# Patient Record
Sex: Female | Born: 1985 | Hispanic: Yes | Marital: Married | Smoking: Never smoker
Health system: Southern US, Community
[De-identification: ages and names within clinical notes are randomized; demographics above are authoritative.]

## PROBLEM LIST (undated history)

## (undated) HISTORY — PX: NO PAST SURGERIES: SHX2092

---

## 2020-04-15 DIAGNOSIS — Z8619 Personal history of other infectious and parasitic diseases: Secondary | ICD-10-CM

## 2020-04-15 HISTORY — DX: Personal history of other infectious and parasitic diseases: Z86.19

## 2020-05-12 ENCOUNTER — Other Ambulatory Visit: Payer: Self-pay

## 2020-05-12 ENCOUNTER — Ambulatory Visit (HOSPITAL_COMMUNITY)
Admission: EM | Admit: 2020-05-12 | Discharge: 2020-05-12 | Disposition: A | Discharge status: Home / Self Care | Payer: Self-pay | Attending: Internal Medicine | Admitting: Internal Medicine

## 2020-05-12 DIAGNOSIS — Z3201 Encounter for pregnancy test, result positive: Secondary | ICD-10-CM

## 2020-05-12 DIAGNOSIS — R109 Unspecified abdominal pain: Secondary | ICD-10-CM

## 2020-05-12 LAB — POCT URINALYSIS DIPSTICK, ED / UC
Bilirubin Urine: NEGATIVE
Glucose, UA: NEGATIVE mg/dL
Ketones, ur: NEGATIVE mg/dL
Nitrite: NEGATIVE
Protein, ur: NEGATIVE mg/dL
Specific Gravity, Urine: 1.01 (ref 1.005–1.030)
Urobilinogen, UA: 0.2 mg/dL (ref 0.0–1.0)
pH: 7 (ref 5.0–8.0)

## 2020-05-12 LAB — POC URINE PREG, ED: Preg Test, Ur: POSITIVE — AB

## 2020-05-12 NOTE — ED Notes (Signed)
Patient is being discharged from the Urgent Care and sent to the [facility] via private vehicle . Per Wendee Beavers, NP, patient is in need of higher level of care due to positive pregnancy test and abdominal pain. Patient is aware and verbalizes understanding of plan of care. There were no vitals filed for this visit.

## 2020-05-12 NOTE — ED Notes (Signed)
Lower abdominal pain, nausea, dizziness and a positive pregnancy test.  Utilized interpreter services.   Spoke to [employer], np.  [facility].  Patient agreeable, family member returning to pick up patient

## 2020-05-14 ENCOUNTER — Encounter (HOSPITAL_COMMUNITY): Payer: Self-pay | Admitting: Obstetrics and Gynecology

## 2020-05-14 ENCOUNTER — Inpatient Hospital Stay (HOSPITAL_COMMUNITY)
Admission: AD | Admit: 2020-05-14 | Discharge: 2020-05-14 | Disposition: A | Discharge status: Home / Self Care | Payer: Self-pay | Attending: Obstetrics and Gynecology | Admitting: Obstetrics and Gynecology

## 2020-05-14 ENCOUNTER — Other Ambulatory Visit: Payer: Self-pay

## 2020-05-14 ENCOUNTER — Inpatient Hospital Stay (HOSPITAL_COMMUNITY): Payer: Self-pay

## 2020-05-14 DIAGNOSIS — O26891 Other specified pregnancy related conditions, first trimester: Secondary | ICD-10-CM | POA: Insufficient documentation

## 2020-05-14 DIAGNOSIS — Z3A01 Less than 8 weeks gestation of pregnancy: Secondary | ICD-10-CM | POA: Insufficient documentation

## 2020-05-14 DIAGNOSIS — O210 Mild hyperemesis gravidarum: Secondary | ICD-10-CM | POA: Insufficient documentation

## 2020-05-14 DIAGNOSIS — R109 Unspecified abdominal pain: Secondary | ICD-10-CM | POA: Insufficient documentation

## 2020-05-14 DIAGNOSIS — Z3490 Encounter for supervision of normal pregnancy, unspecified, unspecified trimester: Secondary | ICD-10-CM

## 2020-05-14 DIAGNOSIS — O99891 Other specified diseases and conditions complicating pregnancy: Secondary | ICD-10-CM | POA: Insufficient documentation

## 2020-05-14 DIAGNOSIS — R300 Dysuria: Secondary | ICD-10-CM | POA: Insufficient documentation

## 2020-05-14 DIAGNOSIS — O2341 Unspecified infection of urinary tract in pregnancy, first trimester: Secondary | ICD-10-CM

## 2020-05-14 LAB — WET PREP, GENITAL
Clue Cells Wet Prep HPF POC: NONE SEEN
Sperm: NONE SEEN
Trich, Wet Prep: NONE SEEN
Yeast Wet Prep HPF POC: NONE SEEN

## 2020-05-14 LAB — COMPREHENSIVE METABOLIC PANEL
ALT: 19 U/L (ref 0–44)
AST: 16 U/L (ref 15–41)
Albumin: 3.6 g/dL (ref 3.5–5.0)
Alkaline Phosphatase: 49 U/L (ref 38–126)
Anion gap: 10 (ref 5–15)
BUN: 6 mg/dL (ref 6–20)
CO2: 22 mmol/L (ref 22–32)
Calcium: 8.8 mg/dL — ABNORMAL LOW (ref 8.9–10.3)
Chloride: 102 mmol/L (ref 98–111)
Creatinine, Ser: 0.49 mg/dL (ref 0.44–1.00)
GFR calc Af Amer: >60 mL/min (ref >60)
GFR calc non Af Amer: >60 mL/min (ref >60)
Glucose, Bld: 95 mg/dL (ref 70–99)
Potassium: 3.3 mmol/L — ABNORMAL LOW (ref 3.5–5.1)
Sodium: 134 mmol/L — ABNORMAL LOW (ref 135–145)
Total Bilirubin: 0.5 mg/dL (ref 0.3–1.2)
Total Protein: 7 g/dL (ref 6.5–8.1)

## 2020-05-14 LAB — URINALYSIS, ROUTINE W REFLEX MICROSCOPIC
Bilirubin Urine: NEGATIVE
Glucose, UA: NEGATIVE mg/dL
Hgb urine dipstick: NEGATIVE
Ketones, ur: 5 mg/dL — AB
Nitrite: NEGATIVE
Protein, ur: NEGATIVE mg/dL
Specific Gravity, Urine: 1.012 (ref 1.005–1.030)
pH: 6 (ref 5.0–8.0)

## 2020-05-14 LAB — CBC
HCT: 38.2 % (ref 36.0–46.0)
Hemoglobin: 12.5 g/dL (ref 12.0–15.0)
MCH: 27.2 pg (ref 26.0–34.0)
MCHC: 32.7 g/dL (ref 30.0–36.0)
MCV: 83 fL (ref 80.0–100.0)
Platelets: 252 10*3/uL (ref 150–400)
RBC: 4.6 MIL/uL (ref 3.87–5.11)
RDW: 13.9 % (ref 11.5–15.5)
WBC: 8.5 10*3/uL (ref 4.0–10.5)
nRBC: 0 % (ref 0.0–0.2)

## 2020-05-14 LAB — HCG, QUANTITATIVE, PREGNANCY: hCG, Beta Chain, Quant, S: 187362 m[IU]/mL — ABNORMAL HIGH (ref <5)

## 2020-05-14 LAB — ABO/RH: ABO/RH(D): O POS

## 2020-05-14 MED ORDER — CEPHALEXIN 500 MG PO CAPS: 500.0000 mg | ORAL_CAPSULE | Freq: Four times a day (QID) | ORAL | 2 refills | Status: DC

## 2020-05-14 NOTE — MAU Note (Signed)
.   Katherine Robles is a 34 y.o. at [redacted]w[redacted]d here in MAU reporting: nausea and pain with urination.  LMP: 03/22/20 Onset of complaint: a week Pain score: 0 Vitals:   05/14/20 1652 05/14/20 1653  BP: 105/61   Pulse: 72   Resp: 16   Temp: 98.5 F (36.9 C)   SpO2:  100%     FHT: Lab orders placed from triage: UA

## 2020-05-14 NOTE — Discharge Instructions (Signed)
Embarazo e infeccin urinaria Pregnancy and Urinary Tract Infection  Una infeccin urinaria (IU) puede ocurrir en Clinical cytogeneticist de las vas [place]. Estas incluyen los riones, los tubos que [employer] riones con la vejiga (urteres), la vejiga y el tubo por el que se elimina la orina del cuerpo (uretra). Estos rganos fabrican, Buyer, retail y eliminan la orina del organismo. El mdico puede usar otras palabras para describir la infeccin. La IU alta afecta los urteres y a los riones (pielonefritis). La IU baja afecta la vejiga (cistitis) y Geologist, engineering (uretritis). La mayora de las infecciones de las vas urinarias es causada por la presencia de bacterias en la zona genital, alrededor de la entrada de las vas urinarias (uretra). Estas bacterias proliferan y causan irritacin e inflamacin de las vas [place]. Usted tiene ms probabilidades de tener una IU durante el embarazo porque los cambios fsicos y hormonales por los que atraviesa el cuerpo pueden hacer que sea ms fcil que las bacterias ingresen en las vas urinarias. El beb en gestacin tambin hace presin sobre la vejiga y puede afectar el flujo de [place]. Es Glass blower/designer y tratar las IU [employer] embarazo debido al riesgo de complicaciones graves tanto para usted como para el beb. Lowry Ram modo me afecta? Entre los sntomas de una IU, se incluyen los siguientes:  Necesidad inmediata (urgente) de Garment/textile technologist.  Miccin frecuente o eliminacin de pequeas cantidades de orina con frecuencia.  Ardor o dolor al [employer].  Presencia de [employer].  Orina con mal olor u [employer] atpico.  Dificultad para Garment/textile technologist.  Bennie Hind turbia.  Dolor en el abdomen o en la parte inferior de la espalda.  Secrecin vaginal. Tambin puede presentar:  Vmitos o disminucin del apetito.  Confusin.  Irritabilidad o cansancio.  Cristy Hilts.  Diarrea. Cmo afecta esto al beb? Una IU no tratada durante el embarazo podra ocasionar una  infeccin renal o una infeccin generalizada, lo que puede causar problemas de salud que afecten al beb. Algunas de las complicaciones posibles de una IU no tratada son las siguientes:  Dar a luz al beb antes de las 37semanas de Media planner (prematuro).  Tener un beb con bajo peso al nacer.  Presentar presin arterial alta durante el embarazo (preeclampsia).  Tener un nivel bajo de hemoglobina (anemia). Qu puedo hacer para disminuir el riesgo? Para prevenir la IU, haga lo siguiente:  Vaya al bao en cuanto sienta la necesidad de [place]. No contenga la orina durante largos perodos de [place].  Lmpiese siempre desde adelante hacia atrs, en especial despus de defecar. Use cada trozo de papel higinico solo una vez cuando se limpie.  Vace la vejiga despus de Clinical biochemist.  Mantenga la zona genital seca.  Bonnita Nasuti entre 6 y 10vasos de agua por Training and development officer.  No se haga duchas vaginales ni use desodorantes en aerosol. Cmo se trata? El tratamiento de esta afeccin puede incluir lo siguiente:  Antibiticos cuyo uso es seguro durante el Media planner.  Otros medicamentos para tratar causas menos frecuentes de IU. Siga estas instrucciones en su casa:  Si le recetaron un antibitico, tmelo como se lo haya indicado el mdico. No deje de usar el antibitico aunque comience a Sports administrator.  Concurra a todas las visitas de seguimiento como se lo haya indicado el mdico. Esto es importante. Comunquese con un mdico si:  Los sntomas no mejoran o empeoran.  Tiene una secrecin vaginal anormal. Solicite ayuda inmediatamente si:  Katherine Robles.  Tiene nuseas y vmitos.  Siente dolor  en la espalda o en el costado del cuerpo.  Siente contracciones en el tero.  Siente dolor en la parte inferior del abdomen.  Tiene una prdida de lquido abundante por la vagina.  Observa sangre en la orina. Resumen  Una infeccin urinaria (IU) es una infeccin en cualquier parte de las  vas urinarias, que incluyen los riones, los urteres, la vejiga y la uretra.  La mayora de las infecciones de las vas urinarias es causada por la presencia de bacterias en la zona genital, alrededor de la entrada de las vas urinarias (uretra).  Es ms probable presentar una IU durante el embarazo.  Si le recetaron un antibitico, tmelo como se lo haya indicado el mdico. No deje de usar el antibitico aunque comience a sentirse mejor. Esta informacin no tiene como fin reemplazar el consejo del mdico. Asegrese de hacerle al mdico cualquier pregunta que tenga. Document Revised: 08/20/2018 Document Reviewed: 08/20/2018 Elsevier Patient Education  2020 Elsevier Inc.  

## 2020-05-14 NOTE — MAU Provider Note (Addendum)
History     CSN: [id number]  Arrival date and time: 05/14/20 1549   First Provider Initiated Contact with Patient 05/14/20 1742      Chief Complaint  Patient presents with  . Dysuria  . Morning Sickness   HPI: Katherine Robles is a 34 y.o. [phone] female at 8177w4d by LMP here with complaints of abdominal pain, nausea and difficulty urinating. She went to the urgent care on Tuesday for abdominal pain and dysuria and received her positive pregnancy test. She was then told to come here. She describes her nausea and abdominal pain as "feeling like she is going to explode" after she eats a small amount of food. Also describes the feeling of needing to burp but being unable to. She did not have this problem in her prior pregnancies. Denies vomiting or GERD. Endorses some decrease in appetite due to her constant nausea. Her last bowel movement was yesterday but she feels as thought she is not able to pass gas like normal. Denies sick contacts.  The patient describes her urinary discomfort as burning when she urinates and "feeling like she is peeing through jelly." She states she has had problems fully emptying her bladder since this began. Her urine color is dark yellow but she denies hematuria. This pain has been gradually increasing over the past week. She has not taken any medication to relieve her pain. Denies alleviating or aggravating factors.   OB History    Gravida  4   Para  2   Term  2   Preterm      AB  1   Living  2     SAB  1   TAB      Ectopic      Multiple      Live Births  2           History reviewed. No pertinent past medical history.  History reviewed. No pertinent surgical history.  History reviewed. No pertinent family history.  Social History   Tobacco Use  . Smoking status: Never Smoker  . Smokeless tobacco: Never Used  Substance Use Topics  . Alcohol use: Never  . Drug use: Never    Allergies: No Known Allergies  No medications  prior to admission.    Review of Systems  Constitutional: Positive for appetite change. Negative for fever.  Respiratory: Negative for cough and shortness of breath.   Cardiovascular: Negative for chest pain.  Gastrointestinal: Positive for abdominal pain and nausea. Negative for constipation, diarrhea and vomiting.  Genitourinary: Positive for difficulty urinating, dysuria, frequency and urgency. Negative for hematuria.  All other systems reviewed and are negative.  Physical Exam   Blood pressure 105/61, pulse 72, temperature 98.5 F (36.9 C), resp. rate 16, height 4\' 11"  (1.499 m), weight 57.2 kg, last menstrual period 03/22/2020, SpO2 100 %.  Physical Exam Constitutional:      General: She is not in acute distress.    Appearance: Normal appearance. She is normal weight. She is not ill-appearing.  Cardiovascular:     Rate and Rhythm: Normal rate and regular rhythm.     Pulses: Normal pulses.     Heart sounds: No murmur heard.   Pulmonary:     Effort: Pulmonary effort is normal.     Breath sounds: Normal breath sounds. No wheezing.  Abdominal:     General: Abdomen is flat. Bowel sounds are increased.     Palpations: Abdomen is soft.     Tenderness: There is  abdominal tenderness in the epigastric area and suprapubic area.  Skin:    Capillary Refill: Capillary refill takes less than 2 seconds.  Neurological:     General: No focal deficit present.     Mental Status: She is alert and oriented to person, place, and time.      Results for orders placed or performed during the hospital encounter of 05/14/20 (from the past 24 hour(s))  Urinalysis, Routine w reflex microscopic Urine, Clean Catch     Status: Abnormal   Collection Time: 05/14/20  5:22 PM  Result Value Ref Range   Color, Urine YELLOW YELLOW   APPearance HAZY (A) CLEAR   Specific Gravity, Urine 1.012 1.005 - 1.030   pH 6.0 5.0 - 8.0   Glucose, UA NEGATIVE NEGATIVE mg/dL   Hgb urine dipstick NEGATIVE NEGATIVE    Bilirubin Urine NEGATIVE NEGATIVE   Ketones, ur 5 (A) NEGATIVE mg/dL   Protein, ur NEGATIVE NEGATIVE mg/dL   Nitrite NEGATIVE NEGATIVE   Leukocytes,Ua MODERATE (A) NEGATIVE   RBC / HPF 0-5 0 - 5 RBC/hpf   WBC, UA 11-20 0 - 5 WBC/hpf   Bacteria, UA RARE (A) NONE SEEN   Squamous Epithelial / LPF 0-5 0 - 5   Budding Yeast PRESENT   CBC     Status: None   Collection Time: 05/14/20  6:07 PM  Result Value Ref Range   WBC 8.5 4.0 - 10.5 K/uL   RBC 4.60 3.87 - 5.11 MIL/uL   Hemoglobin 12.5 12.0 - 15.0 g/dL   HCT 15.1 36 - 46 %   MCV 83.0 80.0 - 100.0 fL   MCH 27.2 26.0 - 34.0 pg   MCHC 32.7 30.0 - 36.0 g/dL   RDW 76.1 60.7 - 37.1 %   Platelets 252 150 - 400 K/uL   nRBC 0.0 0.0 - 0.2 %  Comprehensive metabolic panel     Status: Abnormal   Collection Time: 05/14/20  6:07 PM  Result Value Ref Range   Sodium 134 (L) 135 - 145 mmol/L   Potassium 3.3 (L) 3.5 - 5.1 mmol/L   Chloride 102 98 - 111 mmol/L   CO2 22 22 - 32 mmol/L   Glucose, Bld 95 70 - 99 mg/dL   BUN 6 6 - 20 mg/dL   Creatinine, Ser 0.62 0.44 - 1.00 mg/dL   Calcium 8.8 (L) 8.9 - 10.3 mg/dL   Total Protein 7.0 6.5 - 8.1 g/dL   Albumin 3.6 3.5 - 5.0 g/dL   AST 16 15 - 41 U/L   ALT 19 0 - 44 U/L   Alkaline Phosphatase 49 38 - 126 U/L   Total Bilirubin 0.5 0.3 - 1.2 mg/dL   GFR calc non Af Amer >60 >60 mL/min   GFR calc Af Amer >60 >60 mL/min   Anion gap 10 5 - 15  ABO/Rh     Status: None   Collection Time: 05/14/20  6:07 PM  Result Value Ref Range   ABO/RH(D) O POS    No rh immune globuloin      NOT A RH IMMUNE GLOBULIN CANDIDATE, PT RH POSITIVE Performed at [facility], [address] [place], [place] [address]   hCG, quantitative, pregnancy     Status: Abnormal   Collection Time: 05/14/20  6:07 PM  Result Value Ref Range   hCG, Beta Chain, Sharene Butters, S 462,703 (H) <5 mIU/mL  Wet prep, genital     Status: Abnormal   Collection Time: 05/14/20  6:35 PM  Result Value Ref Range   Yeast Wet Prep HPF POC NONE SEEN  NONE SEEN   Trich, Wet Prep NONE SEEN NONE SEEN   Clue Cells Wet Prep HPF POC NONE SEEN NONE SEEN   WBC, Wet Prep HPF POC MANY (A) NONE SEEN   Sperm NONE SEEN      MAU Course  Procedures  Patient Vitals for the past 24 hrs:  BP Temp Pulse Resp SpO2 Height Weight  05/14/20 2017 106/70 -- 78 -- -- -- --  05/14/20 1653 -- -- -- -- 100 % -- --  05/14/20 1652 105/61 98.5 F (36.9 C) 72 16 -- 4\' 11"  (1.499 m) 57.2 kg   Results for orders placed or performed during the hospital encounter of 05/14/20 (from the past 24 hour(s))  Urinalysis, Routine w reflex microscopic Urine, Clean Catch     Status: Abnormal   Collection Time: 05/14/20  5:22 PM  Result Value Ref Range   Color, Urine YELLOW YELLOW   APPearance HAZY (A) CLEAR   Specific Gravity, Urine 1.012 1.005 - 1.030   pH 6.0 5.0 - 8.0   Glucose, UA NEGATIVE NEGATIVE mg/dL   Hgb urine dipstick NEGATIVE NEGATIVE   Bilirubin Urine NEGATIVE NEGATIVE   Ketones, ur 5 (A) NEGATIVE mg/dL   Protein, ur NEGATIVE NEGATIVE mg/dL   Nitrite NEGATIVE NEGATIVE   Leukocytes,Ua MODERATE (A) NEGATIVE   RBC / HPF 0-5 0 - 5 RBC/hpf   WBC, UA 11-20 0 - 5 WBC/hpf   Bacteria, UA RARE (A) NONE SEEN   Squamous Epithelial / LPF 0-5 0 - 5   Budding Yeast PRESENT   CBC     Status: None   Collection Time: 05/14/20  6:07 PM  Result Value Ref Range   WBC 8.5 4.0 - 10.5 K/uL   RBC 4.60 3.87 - 5.11 MIL/uL   Hemoglobin 12.5 12.0 - 15.0 g/dL   HCT 05/16/20 36 - 46 %   MCV 83.0 80.0 - 100.0 fL   MCH 27.2 26.0 - 34.0 pg   MCHC 32.7 30.0 - 36.0 g/dL   RDW 52.8 41.3 - 24.4 %   Platelets 252 150 - 400 K/uL   nRBC 0.0 0.0 - 0.2 %  Comprehensive metabolic panel     Status: Abnormal   Collection Time: 05/14/20  6:07 PM  Result Value Ref Range   Sodium 134 (L) 135 - 145 mmol/L   Potassium 3.3 (L) 3.5 - 5.1 mmol/L   Chloride 102 98 - 111 mmol/L   CO2 22 22 - 32 mmol/L   Glucose, Bld 95 70 - 99 mg/dL   BUN 6 6 - 20 mg/dL   Creatinine, Ser 05/16/20 0.44 - 1.00 mg/dL    Calcium 8.8 (L) 8.9 - 10.3 mg/dL   Total Protein 7.0 6.5 - 8.1 g/dL   Albumin 3.6 3.5 - 5.0 g/dL   AST 16 15 - 41 U/L   ALT 19 0 - 44 U/L   Alkaline Phosphatase 49 38 - 126 U/L   Total Bilirubin 0.5 0.3 - 1.2 mg/dL   GFR calc non Af Amer >60 >60 mL/min   GFR calc Af Amer >60 >60 mL/min   Anion gap 10 5 - 15  ABO/Rh     Status: None   Collection Time: 05/14/20  6:07 PM  Result Value Ref Range   ABO/RH(D) O POS    No rh immune globuloin      NOT A RH IMMUNE GLOBULIN CANDIDATE, PT  RH POSITIVE Performed at [facility], [address], [place], [place] [address]   hCG, quantitative, pregnancy     Status: Abnormal   Collection Time: 05/14/20  6:07 PM  Result Value Ref Range   hCG, Beta Chain, [place] Longest 604,540 (H) <5 mIU/mL  Wet prep, genital     Status: Abnormal   Collection Time: 05/14/20  6:35 PM  Result Value Ref Range   Yeast Wet Prep HPF POC NONE SEEN NONE SEEN   Trich, Wet Prep NONE SEEN NONE SEEN   Clue Cells Wet Prep HPF POC NONE SEEN NONE SEEN   WBC, Wet Prep HPF POC MANY (A) NONE SEEN   Sperm NONE SEEN    US OB LESS THAN 14 WEEKS WITH OB TRANSVAGINAL  Result Date: 05/14/2020 CLINICAL DATA:  Abdominal pain during pregnancy, quantitative beta hCG 981,191 EXAM: OBSTETRIC <14 WK [place] AND TRANSVAGINAL OB US TECHNIQUE: Both transabdominal and transvaginal ultrasound examinations were performed for complete evaluation of the gestation as well as the maternal uterus, adnexal regions, and pelvic cul-de-sac. Transvaginal technique was performed to assess early pregnancy. COMPARISON:  None. FINDINGS: Intrauterine gestational sac: Single Yolk sac:  Visualized. Embryo:  Visualized. Cardiac Activity: Visualized. Heart Rate: 175 bpm CRL:  20 mm   8 w   3 d                  [place] EDC: 12/21/2020 Subchorionic hemorrhage:  None visualized. Maternal uterus/adnexae: Normal anteverted uterus. No concerning uterine abnormalities. Normal appearance of the ovaries without concerning adnexal lesions.  No pelvic free fluid. IMPRESSION: Single intrauterine gestation with a gestational age of [redacted] weeks, 3 days by crown-rump length sonographic estimation. No acute sonographically evident complication. Electronically Signed   By: Kreg Shropshire M.D.   On: 05/14/2020 19:50     Assessment and Plan  --34 y.o. [id number] with confirmed IUP --UTI, Keflex to pharmacy, urine culture ordered --Language barrier: interpreter utilized for all patient interaction - Discharge home in stable condition  Clayton Bibles, MSN, CNM Certified Nurse Midwife, [employer] for [employer], [facility] 05/14/20 9:33 PM

## 2020-05-15 LAB — GC/CHLAMYDIA PROBE AMP (~~LOC~~) NOT AT ARMC
Chlamydia: POSITIVE — AB
Comment: NEGATIVE
Comment: NORMAL
Neisseria Gonorrhea: NEGATIVE

## 2020-05-16 ENCOUNTER — Other Ambulatory Visit: Payer: Self-pay

## 2020-05-16 LAB — CULTURE, OB URINE: Culture: 80000 — AB

## 2020-05-16 MED ORDER — AZITHROMYCIN 250 MG PO TABS: 1000.0000 mg | ORAL_TABLET | Freq: Once | ORAL | 0 refills | Status: AC

## 2020-05-23 ENCOUNTER — Inpatient Hospital Stay (HOSPITAL_COMMUNITY)
Admission: AD | Admit: 2020-05-23 | Discharge: 2020-05-23 | Disposition: A | Discharge status: Home / Self Care | Payer: Self-pay | Attending: Obstetrics and Gynecology | Admitting: Obstetrics and Gynecology

## 2020-05-23 ENCOUNTER — Encounter (HOSPITAL_COMMUNITY): Payer: Self-pay | Admitting: Obstetrics and Gynecology

## 2020-05-23 ENCOUNTER — Other Ambulatory Visit: Payer: Self-pay

## 2020-05-23 DIAGNOSIS — O98311 Other infections with a predominantly sexual mode of transmission complicating pregnancy, first trimester: Secondary | ICD-10-CM | POA: Insufficient documentation

## 2020-05-23 DIAGNOSIS — O98811 Other maternal infectious and parasitic diseases complicating pregnancy, first trimester: Secondary | ICD-10-CM

## 2020-05-23 DIAGNOSIS — B373 Candidiasis of vulva and vagina: Secondary | ICD-10-CM | POA: Insufficient documentation

## 2020-05-23 DIAGNOSIS — Z87440 Personal history of urinary (tract) infections: Secondary | ICD-10-CM | POA: Insufficient documentation

## 2020-05-23 DIAGNOSIS — A5602 Chlamydial vulvovaginitis: Secondary | ICD-10-CM | POA: Insufficient documentation

## 2020-05-23 DIAGNOSIS — B3731 Acute candidiasis of vulva and vagina: Secondary | ICD-10-CM

## 2020-05-23 DIAGNOSIS — A749 Chlamydial infection, unspecified: Secondary | ICD-10-CM

## 2020-05-23 DIAGNOSIS — Z3A08 8 weeks gestation of pregnancy: Secondary | ICD-10-CM | POA: Insufficient documentation

## 2020-05-23 MED ORDER — AZITHROMYCIN 250 MG PO TABS
500.0000 mg | ORAL_TABLET | Freq: Once | ORAL | Status: AC
  Administered 2020-05-23: 500 mg via ORAL
  Filled 2020-05-23: qty 2

## 2020-05-23 MED ORDER — TERCONAZOLE 0.4 % VA CREA: 1.0000 | TOPICAL_CREAM | Freq: Every day | VAGINAL | 0 refills | Status: DC

## 2020-05-23 NOTE — MAU Provider Note (Signed)
  History     CSN: [id number]  Arrival date and time: 05/23/20 1721   First Provider Initiated Contact with Patient 05/23/20 1758     *Spanish interpreter at bedside for this encounter*  Chief Complaint  Patient presents with  . Vaginal Itching   Katherine Robles is a 34 y.o. [id number] at [redacted]w[redacted]d who presents with vaginal itching. Symptoms started earlier this week. Reports white vaginal discharge & external itching. Denies abdominal pain or vaginal bleeding. Was treated for UTI & finished those antibiotics a few days ago. Also given prescription to treat chlamydia - has been taking 1 pill per day for the last 2 days. Has not had intercourse since started chlamydia treatment. States her partner has gone for treatment.    OB History    Gravida  4   Para  2   Term  2   Preterm      AB  1   Living  2     SAB  1   TAB      Ectopic      Multiple      Live Births  2           Past Medical History:  Diagnosis Date  . Hx of chlamydia infection 04/2020    No past surgical history on file.  No family history on file.  Social History   Tobacco Use  . Smoking status: Never Smoker  . Smokeless tobacco: Never Used  Substance Use Topics  . Alcohol use: Never  . Drug use: Never    Allergies: No Known Allergies  No medications prior to admission.    Review of Systems  Constitutional: Negative.   Gastrointestinal: Negative.   Genitourinary: Positive for vaginal discharge. Negative for vaginal bleeding.       + vulvar itching   Physical Exam   Blood pressure 105/63, pulse 79, temperature 98.3 F (36.8 C), resp. rate 16, last menstrual period 03/22/2020, SpO2 100 %.  Physical Exam Vitals and nursing note reviewed.  Constitutional:      General: She is not in acute distress.    Appearance: Normal appearance.  Pulmonary:     Effort: Pulmonary effort is normal. No respiratory distress.  Psychiatric:        Mood and Affect: Mood normal.        Behavior:  Behavior normal.     MAU Course  Procedures No results found for this or any previous visit (from the past 24 hour(s)).  MDM Vaginal yeast infection likely due to antibiotics taken this week. Will prescribe terazol. Also reviewed hygiene for symptoms. Has no abdominal pain or vaginal bleeding.   Discussed proper medication dosing of azithromycin. Patient has been taking daily x 2 doses instead of all at once. States she didn't know b/c prescription was not in [place].  Will give full 1 gm azithromycin dose to her while she is here in MAU.   Assessment and Plan   1. Vaginal yeast infection  -rx terazol  2. Chlamydia infection affecting pregnancy in first trimester  -retreated while in triage today -pelvic rest x 1 week     [name] 05/23/2020, 8:22 PM

## 2020-05-23 NOTE — MAU Note (Signed)
Pt reports to mau with c/o vag itching since last Sunday.  Pt denies any pain or vag bleeding at this time.  Denies any irregular dc.

## 2020-05-23 NOTE — Discharge Instructions (Signed)
Infeccin mictica vaginal en los adultos Vaginal Yeast Infection, Adult  La infeccin mictica vaginal es una afeccin que causa secrecin vaginal y tambin dolor, hinchazn y enrojecimiento (inflamacin) de la vagina. Esta es una afeccin frecuente. Algunas mujeres contraen esta infeccin con frecuencia. Cules son las causas? La causa de la infeccin es un cambio en el equilibrio normal de los hongos (cndida) y las bacterias que viven en la vagina. Esta alteracin deriva en el crecimiento excesivo de los hongos, lo que causa la inflamacin. Qu incrementa el riesgo? La afeccin es ms probable en las mujeres que tienen estas caractersticas:  Toman antibiticos.  Tienen diabetes.  Toman anticonceptivos orales.  Estn embarazadas.  Se hacen duchas vaginales con frecuencia.  Tienen debilitado el sistema de defensa del organismo (sistema inmunitario).  Han estado tomando medicamentos con corticoesteroides durante mucho tiempo.  Usan ropa ajustada con frecuencia. Cules son los signos o sntomas? Los sntomas de esta afeccin incluyen:  Secrecin vaginal blanca, cremosa y espesa.  Hinchazn, picazn, enrojecimiento e irritacin de la vagina. Los labios de la vagina (vulva) tambin se pueden infectar.  Dolor o ardor al orinar.  Dolor durante las relaciones sexuales. Cmo se diagnostica? Esta afeccin se diagnostica en funcin de lo siguiente:  Sus antecedentes mdicos.  Un examen fsico.  Un examen plvico. El mdico examinar una muestra de la secrecin vaginal con un microscopio. Probablemente el mdico enve esta muestra al laboratorio para analizarla y confirmar el diagnstico. Cmo se trata? Esta afeccin se trata con medicamentos. Los medicamentos pueden ser recetados o de venta libre. Podrn indicarle que use uno o ms de lo siguiente:  Medicamentos que se toman por boca (orales).  Medicamentos que se aplican como una crema (tpicos).  Medicamentos que se  colocan directamente en la vagina (vulos vaginales). Siga estas instrucciones en su casa:  Estilo de vida  No tenga relaciones sexuales hasta que el mdico lo autorice. Comunique a su compaero sexual que tiene una infeccin por hongos. Esa persona debera visitar a su mdico y preguntarle si debera recibir tratamiento tambin.  No use ropa ajustada, como pantimedias o pantalones ajustados.  Use ropa interior de algodn, que permite el paso del aire. Instrucciones generales  Tome o aplquese los medicamentos de venta libre y los recetados solamente como se lo haya indicado el mdico.  Consuma ms yogur. Esto puede ayudar a evitar la recurrencia de la infeccin mictica.  No use tampones hasta que el mdico la autorice.  Intente darse un bao de asiento para aliviar las molestias. Se trata de un bao de agua tibia que se toma mientras se est sentada. El agua solo debe llegar hasta las caderas y cubrir las nalgas. Hgalo 3o 4veces al da o como se lo haya indicado el mdico.  No se haga duchas vaginales.  Si tiene diabetes, mantenga bajo control los niveles de azcar en la sangre.  Concurra a todas las visitas de seguimiento como se lo haya indicado el mdico. Esto es importante. Comunquese con un mdico si:  Tiene fiebre.  Los sntomas desaparecen y luego vuelven a aparecer.  Los sntomas no mejoran con el tratamiento.  Sus sntomas empeoran.  Aparecen nuevos sntomas.  Aparecen ampollas alrededor o adentro de la vagina.  Le sale sangre de la vagina y no est menstruando.  Siente dolor en el abdomen. Resumen  La infeccin mictica vaginal es una afeccin que causa secrecin y tambin dolor, hinchazn y enrojecimiento (inflamacin) de la vagina.  Esta afeccin se trata con medicamentos. Los   medicamentos pueden ser recetados o de [address].  Tome o aplquese los medicamentos de venta libre y los recetados solamente como se lo haya indicado el mdico.  No se haga  duchas vaginales. No tenga relaciones sexuales ni use tampones hasta que el mdico la autorice.  Comunquese con un mdico si los sntomas no mejoran con el tratamiento o si los sntomas desaparecen y [employer]. Esta informacin no tiene Theme park manager el consejo del mdico. Asegrese de hacerle al mdico cualquier pregunta que tenga. Document Revised: 04/16/2019 Document Reviewed: 04/16/2019 Elsevier Patient Education  2020 [employer].      Clamidia en las mujeres Chlamydia, Female La clamidia es una ETS (enfermedad de transmisin sexual). Es una infeccin bacteriana que se transmite (es contagiosa) por contacto sexual. La clamidia se puede manifestar en diferentes partes del cuerpo, entre las que se incluyen las siguientes:  El conducto por el que drena la orina de la vejiga y se elimina del cuerpo (uretra).  La zona inferior del tero (cuello uterino).  La garganta.  El recto. El tratamiento de esta afeccin no es complicado. Sin embargo, si se la deja sin tratar, la clamidia puede derivar en problemas de salud ms graves, entre ellos, enfermedad inflamatoria plvica (EIP). La EIP puede aumentar el riesgo de no poder tener hijos (esterilidad). Adems, si la clamidia no se trata y est [place] o [place], existe la probabilidad de que el beb puede infectarse durante el [place]. Esto puede causar problemas de salud graves al beb. Cules son las causas? La clamidia es causada por la bacteria Chlamydia trachomatis. Se trasmite a travs de una pareja sexual infectada durante el contacto sexual. La clamidia se puede transmitir mediante el contacto con los genitales, la boca o el recto. Cules son los signos o los sntomas? En algunos casos, es probable que esta afeccin no presente sntomas (asintomtica), especialmente al inicio de la infeccin. Si hay sntomas, algunos de ellos pueden ser los siguientes:  Ardor al [employer].  Ganas frecuentes de  Geographical information systems officer.  Secrecin vaginal.  Enrojecimiento, dolor e hinchazn (inflamacin) del recto.  Secrecin o sangrado del recto.  Dolor abdominal.  Dolor durante las relaciones sexuales.  Sangrado entre los perodos [employer].  Picazn, ardor o enrojecimiento en los ojos o secreciones en los ojos. Cmo se diagnostica? Esta afeccin puede diagnosticarse en funcin de lo siguiente:  Anlisis de [place].  Pruebas de hisopado. Segn sus sntomas, el mdico puede usar un hisopo de algodn para Water quality scientist secrecin de su vagina o del recto para determinar la presencia de la bacteria.  Examen plvico. Cmo se trata? Esta afeccin se trata con antibiticos. Si est embarazada, deber evitar determinados tipos de antibiticos. Siga estas indicaciones en su casa: Medicamentos  [employer] de venta libre y los recetados solamente como se lo haya indicado el mdico.  Tome su antibitico como se lo haya indicado el mdico. No deje de tomar el antibitico aunque comience a sentirse mejor. Actividad sexual  Hable con sus parejas sexuales sobre su infeccin. Esto incluye toda pareja con la que haya tenido sexo oral, anal o vaginal en el transcurso de los [address] a partir de la aparicin de los sntomas. Las parejas sexuales tambin se debern tratar, incluso si no tienen signos de la enfermedad.  No tenga relaciones sexuales hasta que usted y sus parejas sexuales hayan completado el tratamiento y su mdico lo autorice. Si su mdico le recet un tratamiento de dosis nica, espere 7 das despus de  recibir el tratamiento para Management consultant. Indicaciones generales  Es su responsabilidad retirar el resultado del [place]. Consulte a su mdico o en el departamento donde se realice el estudio cundo estarn [employer].  Descanse mucho.  Consuma una dieta sana y Alesia Banda equilibrada.  Beba suficiente lquido para Photographer orina clara o de color amarillo  plido.  Concurra a todas las visitas de control como se lo haya indicado el mdico. Esto es importante. Es posible que deba volver a realizarse una prueba a los 3 meses de haber finalizado el tratamiento para Engineer, manufacturing la presencia de la infeccin. Cmo se evita? La nica manera segura de evitar la clamidia es no tener relaciones sexuales. Sin embargo, puede [employer] riesgos si toma las siguientes precauciones:  Use preservativos de ltex correctamente cada vez que tenga relaciones sexuales.  No tenga mltiples parejas sexuales.  Pregntele a su pareja si se ha realizado pruebas de ETS y si los [address]. Comunquese con un mdico si:  Presenta nuevos sntomas o sus sntomas no mejoran despus de completar el tratamiento.  Tiene fiebre o escalofros.  Siente dolor durante las [employer]. Solicite ayuda inmediatamente si:  El dolor empeora o no mejora con los medicamentos.  Tiene sntomas similares a los de [employer], como sudor nocturno, Engineer, mining de garganta o Sport and exercise psychologist.  Tiene nuseas o vmitos.  Tiene dificultad para tragar.  Tiene sangrado entre los periodos menstruales o despus de Management consultant.  Tiene perodos menstruales irregulares.  Tiene dolor abdominal o en la parte inferior de la espalda que no mejora con medicamentos.  Se siente mareada, dbil o se desmaya.  Est embarazada y tiene sntomas de clamidia. Resumen  La clamidia es una ETS (enfermedad de transmisin sexual). Es una infeccin bacteriana que se transmite (es contagiosa) por contacto sexual.  Sin embargo, el tratamiento de esta afeccin no es complicado. Sin embargo, si se la deja sin tratar, la clamidia puede derivar en problemas de salud ms graves, entre ellos, enfermedad inflamatoria plvica (EIP).  En algunos casos, esta afeccin puede no presentar sntomas (asintomtica).  Esta afeccin se trata con antibiticos.  El uso correcto de preservativos  de ltex cada vez que tenga relaciones sexuales puede ayudar a evitar la clamidia. Esta informacin no tiene Theme park manager el consejo del mdico. Asegrese de hacerle al mdico cualquier pregunta que tenga. Document Revised: 03/13/2018 Document Reviewed: 11/21/2016 Elsevier Patient Education  2020 [employer].

## 2020-07-30 ENCOUNTER — Other Ambulatory Visit: Payer: Self-pay

## 2020-07-30 ENCOUNTER — Ambulatory Visit (HOSPITAL_COMMUNITY)
Admission: EM | Admit: 2020-07-30 | Discharge: 2020-07-30 | Disposition: A | Discharge status: Home / Self Care | Payer: Self-pay

## 2020-07-30 NOTE — ED Notes (Signed)
Patient refused to be seen when it was realized needed payment for visit

## 2020-07-31 ENCOUNTER — Other Ambulatory Visit: Payer: Self-pay

## 2020-07-31 DIAGNOSIS — Z20822 Contact with and (suspected) exposure to covid-19: Secondary | ICD-10-CM

## 2020-08-02 LAB — NOVEL CORONAVIRUS, NAA: SARS-CoV-2, NAA: NOT DETECTED

## 2020-08-02 LAB — SARS-COV-2, NAA 2 DAY TAT

## 2020-08-15 NOTE — L&D Delivery Note (Signed)
OB/GYN Faculty Practice Delivery Note  Katherine Robles is a 35 y.o. [phone] s/p vaginal delivery at [redacted]w[redacted]d. She was admitted for spontaneous onset of labor.   ROM: 1h 38m with moderate meconium stained fluid GBS Status: negative Maximum Maternal Temperature: 98.45F  Labor Progress: Pt presented in active labor. AROM for moderate meconium stained fluid at 2356. Pt progressed to complete cervical dilation at 0128. She then had an uncomplicated delivery as noted below.  Delivery Date/Time: 12/22/20 at 0141 Delivery: Called to room and patient was complete and pushing. Head delivered LOA. No nuchal cord present. Shoulder and body delivered in usual fashion. Infant with spontaneous cry, placed on mother's abdomen, dried and stimulated. Cord clamped x 2 after 1-minute delay, and cut by RN under my direct supervision. Cord blood drawn. Placenta delivered spontaneously with gentle cord traction. Fundus firm with massage and Pitocin. Labia, perineum, vagina, and cervix were inspected, notable for second degree perineal laceration s/p repair in standard fashion with use of 3-0 vicryl.  Placenta: intact, 3-vessel cord, sent to L&D Complications: none Lacerations: 2nd degree perineal laceration s/p repair as noted above EBL: 350 ml s/p TXA Analgesia: IV fentanyl  Infant: viable female  APGARs 8 & 9  weight 3910 g  Lynnda Shields, MD OB/GYN Fellow, Faculty Practice

## 2020-09-15 ENCOUNTER — Ambulatory Visit: Payer: Self-pay | Admitting: *Deleted

## 2020-09-15 ENCOUNTER — Other Ambulatory Visit: Payer: Self-pay

## 2020-09-15 VITALS — BP 106/64 | Wt 132.0 lb

## 2020-09-15 DIAGNOSIS — R87810 Cervical high risk human papillomavirus (HPV) DNA test positive: Secondary | ICD-10-CM

## 2020-09-15 DIAGNOSIS — R87610 Atypical squamous cells of undetermined significance on cytologic smear of cervix (ASC-US): Secondary | ICD-10-CM

## 2020-09-15 DIAGNOSIS — Z1239 Encounter for other screening for malignant neoplasm of breast: Secondary | ICD-10-CM

## 2020-09-15 NOTE — Progress Notes (Signed)
Katherine Robles Katherine Robles is a 35 y.o. female who presents to [facility] clinic today with no complaints. Patient [facility] to [facility] by [facility] due to having an abnormal Pap smear 08/20/2020 that a colposcopy is recommended for follow-up.   Pap Smear: Pap smear not completed today. Last Pap smear was 08/20/2020 at the [facility] clinic and was ASCUS with positive HPV. Per patient has no history of an abnormal Pap smear prior to her most recent Pap smear. Last Pap smear result is available in Epic.   Physical exam: Breasts Breasts symmetrical. No skin abnormalities bilateral breasts. No nipple retraction left breast. Right nipple slightly inverted that per patient is normal for her. No nipple discharge bilateral breasts. No lymphadenopathy. No lumps palpated bilateral breasts. No complaints of pain or tenderness on exam. Screening mammogram recommended at age 73 unless clinically indicated prior.       Pelvic/Bimanual Pap is not indicated today per BCCCP guidelines.   Smoking History: Patient has never smoked.   Patient Navigation: Patient education provided. Access to services provided for patient through [facility] program. Spanish interpreter Natale Lay from [facility] provided.    Breast and Cervical Cancer Risk Assessment: Patient does not have family history of breast cancer, known genetic mutations, or radiation treatment to the chest before age 83. Patient does not have history of cervical dysplasia, immunocompromised, or DES exposure in-utero. Breast cancer risk assessment completed. No breast cancer risk calculated due to patient is less than 77 years old.  Risk Assessment    Risk Scores      09/15/2020   Last edited by: Narda Rutherford, LPN   5-year risk:    Lifetime risk:           A: [facility]CP exam without pap smear No complaints.  P: Referred patient to the [facility] for [facility] for a colposcopy to follow-up for her  abnormal Pap smear. Appointment scheduled Monday, September 21, 2020 at 1355.  Priscille Heidelberg, RN 09/15/2020 10:38 AM

## 2020-09-15 NOTE — Patient Instructions (Addendum)
Explained breast self awareness with Katherine Robles. Patient did not need a Pap smear today due to last Pap smear was 08/20/2020. Explained the colposcopy the recommended follow-up for her abnormal Pap smear. Referred patient to the [facility] for [facility] for a colposcopy to follow-up for her abnormal Pap smear. Appointment scheduled Monday, September 21, 2020 at 1355. Patient aware of appointment and will be there. Let patient know a screening mammogram is recommended at age 18 unless clinically indicated prior. Manda Dyonna Jaspers verbalized understanding.  Bernarda Erck, Kathaleen Maser, RN 10:38 AM

## 2020-09-21 ENCOUNTER — Other Ambulatory Visit (HOSPITAL_COMMUNITY)
Admission: RE | Admit: 2020-09-21 | Discharge: 2020-09-21 | Disposition: A | Discharge status: Home / Self Care | Payer: Self-pay | Source: Ambulatory Visit | Attending: Obstetrics & Gynecology | Admitting: Obstetrics & Gynecology

## 2020-09-21 ENCOUNTER — Encounter: Payer: Self-pay | Admitting: Obstetrics & Gynecology

## 2020-09-21 ENCOUNTER — Ambulatory Visit (INDEPENDENT_AMBULATORY_CARE_PROVIDER_SITE_OTHER): Payer: Self-pay | Admitting: Obstetrics & Gynecology

## 2020-09-21 ENCOUNTER — Other Ambulatory Visit: Payer: Self-pay

## 2020-09-21 VITALS — BP 97/64 | HR 100 | Wt 133.7 lb

## 2020-09-21 DIAGNOSIS — N898 Other specified noninflammatory disorders of vagina: Secondary | ICD-10-CM | POA: Insufficient documentation

## 2020-09-21 DIAGNOSIS — R87810 Cervical high risk human papillomavirus (HPV) DNA test positive: Secondary | ICD-10-CM

## 2020-09-21 DIAGNOSIS — R87610 Atypical squamous cells of undetermined significance on cytologic smear of cervix (ASC-US): Secondary | ICD-10-CM | POA: Insufficient documentation

## 2020-09-21 DIAGNOSIS — Z3A26 26 weeks gestation of pregnancy: Secondary | ICD-10-CM

## 2020-09-21 NOTE — Progress Notes (Signed)
In person- Katherine Robles, interpreter

## 2020-09-21 NOTE — Patient Instructions (Signed)
[email or web address]  Colposcopa, cuidados posteriores Colposcopy, Care After Esta hoja le brinda informacin sobre cmo cuidarse despus del procedimiento. Su mdico tambin podr darle instrucciones ms especficas. Comunquese con el mdico si tiene problemas o preguntas. Qu puedo esperar despus del procedimiento? Si le realizaron una colposcopa sin biopsia, puede esperar sentirse bien inmediatamente despus del procedimiento. Sin embargo, es posible que tenga algo de [place] de sangre durante [place]. Puede retomar sus actividades habituales. Si se le realiz una colposcopa con biopsia, despus del procedimiento es frecuente que presente lo siguiente:  Sensibilidad y dolor leve. Esto puede durar [employer].  Desvanecimiento.  Sangrado leve de la vagina o secrecin de color oscuro y Librarian, academic. Esto puede durar [employer]. La secrecin puede deberse a un lquido (solucin) que se [place] durante el procedimiento. Durante este tiempo deber usar un apsito sanitario.  Manchas de [employer] al menos 48horas despus del procedimiento. Siga estas instrucciones en su casa: Medicamentos  Use los medicamentos de venta libre y los recetados solamente como se lo haya indicado el mdico.  Hable con el mdico acerca de qu tipo de analgsico de venta libre y recetado puede volver a Careers adviser. Es especialmente importante que hable con el mdico si toma anticoagulantes. Actividad  Limite la actividad fsica Education officer, museum despus del procedimiento como se lo haya indicado el mdico.  Evite usar productos de ducha vaginal, usar tampones o Child psychotherapist sexuales durante al menos 3 das despus del procedimiento o durante el tiempo que le hayan indicado.  Retome sus actividades normales segn lo indicado el mdico. Pregntele al mdico qu actividades son seguras para usted. Instrucciones generales  Beber suficiente lquido como para mantener la orina  de color amarillo plido.  Pregunte al mdico si puede tomar baos de inmersin, nadar o usar el jacuzzi. Puede ducharse.  Si [place] un mtodo anticonceptivo (anticoncepcin), contine utilizndolo.  Concurra a todas las visitas de [address] se lo haya indicado el mdico. Esto es importante.   Comunquese con un mdico si:  Tiene una erupcin cutnea. Solicite ayuda de inmediato si:  Sangra mucho por la vagina o le salen cogulos de [place]. Esto incluye usar ms de un apsito sanitario cada hora durante 2 horas seguidas.  Tiene fiebre o escalofros.  Tiene secrecin vaginal que es anormal, tiene color amarillo o tiene mal olor. Puede ser un signo de infeccin.  Tiene dolor intenso o clicos en la parte baja del abdomen que no se van con medicamentos.  Se desmaya. Resumen  Si se le realiz una colposcopa sin biopsia, puede esperar sentirse bien de inmediato, pero es posible que presente manchas de sangre por [address]. Puede retomar sus actividades habituales.  Si se le realiz una colposcopa con biopsia, es comn tener un dolor leve durante [address] y [place] de sangre durante 48 horas despus del procedimiento.  Evite usar productos de ducha vaginal, usar tampones y Child psychotherapist sexuales durante al menos 3 das despus del procedimiento o durante el tiempo que le haya indicado el mdico.  Busque ayuda de inmediato si tiene sangrado profuso, dolor intenso o signos de infeccin. Esta informacin no tiene Theme park manager el consejo del mdico. Asegrese de hacerle al mdico cualquier pregunta que tenga. Document Revised: 09/09/2019 Document Reviewed: 09/09/2019 Elsevier Patient Education  2021 [employer].

## 2020-09-21 NOTE — Progress Notes (Signed)
Colposcopy consent signed

## 2020-09-21 NOTE — Progress Notes (Signed)
35 y.o. [id number] Married Hispanic or Latino female here for colposcopy with possible biopsies and/or ECC due to ASCUS Pap with +HR HPV obtained 08/2019 at [facility].  Pt reports she's never had an abnormal prior to this.  HR HPV was positive.  She has many questions.  HR HPV was discussed with pt.  Natural history of disease discussed.  Questions answered.  Pt does have hx of chlamydia.  Spouse was treated as well.  She is having some vaginal discharge that is yellowish.  Patient's last menstrual period was 03/22/2020.          Sexually active: Yes.     Patient has been counseled about results and procedure.  Risks and benefits have bene reviewed including immediate and/or delayed bleeding, infection, cervical scaring from procedure, possibility of needing additional follow up as well as treatment.  rare risks of missing a lesion discussed as well.  All questions answered.  Pt ready to proceed.  BP 97/64   Pulse 100   Wt 133 lb 11.2 oz (60.6 kg)   LMP 03/22/2020   BMI 27.00 kg/m   Physical Exam Exam conducted with a chaperone present.  Constitutional:      Appearance: Normal appearance.  Genitourinary:    General: Normal vulva.     Vagina: Normal.     Cervix: Discharge (yellowish but without odor) present.     Neurological:     General: No focal deficit present.     Mental Status: She is alert and oriented to person, place, and time.    Speculum placed.  3% acetic acid applied to cervix for >45 seconds.  Cervix visualized with both 7.5X and 15X magnification.  Green filter also used.  Lugols solution was not used.  Findings:  HPV changes noted around the cervix with AWE noted at 10-11 o'clock.  No mosaicism was noted.  Biopsy:  10 o'clock obtained.  ECC:  was not performed.  Monsel's was needed.  Excellent hemostasis was present.  Pt tolerated procedure well and all instruments were removed.  Findings noted above on picture of cervix.  Chaperone, Gershon Crane, RN, was present during  procedure.  Assessment/Plan: 1. Atypical squamous cell changes of undetermined significance (ASCUS) on cervical cytology with positive high risk human papilloma virus (HPV) - Surgical pathology( [facility]/ [facility]) - results will be called to pt and recommendations made at that time  2. Vaginal discharge - Cervicovaginal ancillary only  3. [redacted] weeks gestation of pregnancy - fetal heart tones at 140 noted today

## 2020-09-22 ENCOUNTER — Telehealth: Payer: Self-pay

## 2020-09-22 LAB — CERVICOVAGINAL ANCILLARY ONLY
Bacterial Vaginitis (gardnerella): NEGATIVE
Candida Glabrata: NEGATIVE
Candida Vaginitis: NEGATIVE
Chlamydia: NEGATIVE
Comment: NEGATIVE
Comment: NEGATIVE
Comment: NEGATIVE
Comment: NEGATIVE
Comment: NEGATIVE
Comment: NORMAL
Neisseria Gonorrhea: NEGATIVE
Trichomonas: NEGATIVE

## 2020-09-22 NOTE — Telephone Encounter (Addendum)
-----   Message from Jerene Bears, MD sent at 09/22/2020  4:23 PM EST ----- Please call pt with translator and let her know her vaginitis testing was completely negative.  She does have a hx of chlamydia and was very worried about this.  Her pathology from her colposcopy is still pending.  Thank you.  Notified pt results with [employer].   Pt stated thank you and pt did not have any other questions.   Addison Naegeli, RN  09/22/20

## 2020-09-24 LAB — SURGICAL PATHOLOGY

## 2020-09-30 ENCOUNTER — Telehealth: Payer: Self-pay

## 2020-09-30 NOTE — Telephone Encounter (Addendum)
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  She had an ASCUS pap with +HR HPV.  Pt is pregnancy so no ECC was performed.  Pap and HR HPV should be repeated in one year.  She will need spanish speaking translator.  Thanks.   Called pt with interpreter Eda, results given.

## 2020-11-03 ENCOUNTER — Encounter (HOSPITAL_COMMUNITY): Payer: Self-pay | Admitting: Obstetrics and Gynecology

## 2020-11-03 ENCOUNTER — Other Ambulatory Visit: Payer: Self-pay

## 2020-11-03 ENCOUNTER — Inpatient Hospital Stay (HOSPITAL_COMMUNITY)
Admission: AD | Admit: 2020-11-03 | Discharge: 2020-11-03 | Disposition: A | Discharge status: Home / Self Care | Payer: Self-pay | Attending: Obstetrics and Gynecology | Admitting: Obstetrics and Gynecology

## 2020-11-03 DIAGNOSIS — O98813 Other maternal infectious and parasitic diseases complicating pregnancy, third trimester: Secondary | ICD-10-CM | POA: Insufficient documentation

## 2020-11-03 DIAGNOSIS — Z3A33 33 weeks gestation of pregnancy: Secondary | ICD-10-CM | POA: Insufficient documentation

## 2020-11-03 DIAGNOSIS — O99891 Other specified diseases and conditions complicating pregnancy: Secondary | ICD-10-CM

## 2020-11-03 DIAGNOSIS — B373 Candidiasis of vulva and vagina: Secondary | ICD-10-CM | POA: Insufficient documentation

## 2020-11-03 DIAGNOSIS — B3731 Acute candidiasis of vulva and vagina: Secondary | ICD-10-CM

## 2020-11-03 DIAGNOSIS — Z3689 Encounter for other specified antenatal screening: Secondary | ICD-10-CM | POA: Insufficient documentation

## 2020-11-03 DIAGNOSIS — M25552 Pain in left hip: Secondary | ICD-10-CM

## 2020-11-03 LAB — WET PREP, GENITAL
Clue Cells Wet Prep HPF POC: NONE SEEN
Sperm: NONE SEEN
Trich, Wet Prep: NONE SEEN
Yeast Wet Prep HPF POC: NONE SEEN

## 2020-11-03 LAB — GC/CHLAMYDIA PROBE AMP (~~LOC~~) NOT AT ARMC
Chlamydia: NEGATIVE
Comment: NEGATIVE
Comment: NORMAL
Neisseria Gonorrhea: NEGATIVE

## 2020-11-03 LAB — URINALYSIS, ROUTINE W REFLEX MICROSCOPIC
Bilirubin Urine: NEGATIVE
Glucose, UA: NEGATIVE mg/dL
Hgb urine dipstick: NEGATIVE
Ketones, ur: NEGATIVE mg/dL
Nitrite: NEGATIVE
Protein, ur: NEGATIVE mg/dL
Specific Gravity, Urine: 1.004 — ABNORMAL LOW (ref 1.005–1.030)
pH: 7 (ref 5.0–8.0)

## 2020-11-03 MED ORDER — TERCONAZOLE 0.4 % VA CREA: 1.0000 | TOPICAL_CREAM | Freq: Every day | VAGINAL | 0 refills | Status: DC

## 2020-11-03 MED ORDER — ACETAMINOPHEN 500 MG PO TABS
1000.0000 mg | ORAL_TABLET | Freq: Four times a day (QID) | ORAL | Status: DC | PRN
  Administered 2020-11-03: 1000 mg via ORAL
  Filled 2020-11-03: qty 2

## 2020-11-03 MED ORDER — LACTATED RINGERS IV BOLUS
1000.0000 mL | Freq: Once | INTRAVENOUS | Status: AC
  Administered 2020-11-03: 1000 mL via INTRAVENOUS

## 2020-11-03 NOTE — Discharge Instructions (Signed)
Infeccin mictica vaginal en los adultos Vaginal Yeast Infection, Adult  La infeccin mictica vaginal es una afeccin que causa secrecin vaginal y Garment/textile technologist, hinchazn y enrojecimiento (inflamacin) de la vagina. Esta es una afeccin frecuente. Algunas mujeres contraen esta infeccin con frecuencia. Cules son las causas? La causa de la infeccin es un cambio en el equilibrio normal de los hongos (cndida) y las bacterias que viven en la vagina. Esta alteracin deriva en el crecimiento excesivo de los hongos, lo que causa la inflamacin. Qu incrementa el riesgo? La afeccin es ms probable en las mujeres que tienen estas caractersticas:  Toman antibiticos.  Tienen diabetes.  Toman anticonceptivos orales.  Estn embarazadas.  Se hacen duchas vaginales con frecuencia.  Tienen debilitado el sistema de defensa del organismo (sistema inmunitario).  Han estado tomando medicamentos con corticoesteroides durante [employer].  Usan ropa ajustada con frecuencia. Cules son los signos o sntomas? Los sntomas de esta afeccin incluyen:  Secrecin vaginal blanca, cremosa y espesa.  Hinchazn, picazn, enrojecimiento e irritacin de la vagina. Los labios de la vagina (vulva) tambin se pueden infectar.  Dolor o ardor al Garment/textile technologist.  [place]. Cmo se diagnostica? Esta afeccin se diagnostica en funcin de lo siguiente:  Sus antecedentes mdicos.  Un examen fsico.  Un examen plvico. El mdico examinar una muestra de la secrecin vaginal con un microscopio. Probablemente el mdico enve esta muestra al laboratorio para analizarla y confirmar el diagnstico. Cmo se trata? Esta afeccin se trata con medicamentos. Los [employer] pueden ser recetados o de venta libre. Podrn indicarle que use uno o ms de lo siguiente:  Medicamentos que se toman por boca (orales).  Medicamentos que se aplican como una crema (tpicos).  Medicamentos que se  colocan directamente en la vagina (vulos vaginales). Siga estas instrucciones en su casa: Estilo de vida  No tenga relaciones sexuales hasta que el mdico lo autorice. Comunique a su compaero sexual que tiene una infeccin por hongos. Esa persona debera visitar a su mdico y preguntarle si debera recibir tratamiento tambin.  No use ropa ajustada, como pantimedias o pantalones ajustados.  Use ropa interior de algodn, que permite el paso del aire. Instrucciones generales  Tome o aplquese los medicamentos de venta libre y los recetados solamente como se lo haya indicado el mdico.  Consuma ms yogur. Esto puede ayudar a Technical brewer de la infeccin mictica.  No use tampones hasta que el mdico la autorice.  Intente darse un bao de asiento para [employer]. Se trata de un bao de agua tibia que se toma mientras se est sentada. El agua solo debe Systems analyst las caderas y cubrir las nalgas. Hgalo 3o 4veces al da o como se lo haya indicado el mdico.  No se haga duchas vaginales.  Si tiene diabetes, mantenga bajo control los niveles de Dispensing optician.  Concurra a todas las visitas de seguimiento como se lo haya indicado el mdico. Esto es importante.   Comunquese con un mdico si:  Tiene fiebre.  Los sntomas desaparecen y luego vuelven a Arts administrator.  Los sntomas no mejoran con Dispensing optician.  Sus sntomas empeoran.  Aparecen nuevos sntomas.  Aparecen ampollas alrededor o adentro de la vagina.  Le sale sangre de la vagina y no est menstruando.  Siente dolor en el abdomen. Resumen  La infeccin mictica vaginal es una afeccin que causa secrecin y Garment/textile technologist, hinchazn y enrojecimiento (inflamacin) de la vagina.  Esta afeccin se trata con medicamentos.  Los medicamentos pueden ser recetados o de venta libre.  Tome o aplquese los medicamentos de venta libre y los recetados solamente como se lo haya indicado el mdico.  No se haga  duchas vaginales. No tenga relaciones sexuales ni use tampones hasta que el mdico la autorice.  Comunquese con un mdico si los sntomas no mejoran con el tratamiento o si los sntomas desaparecen y luego regresan. Esta informacin no tiene como fin reemplazar el consejo del mdico. Asegrese de hacerle al mdico cualquier pregunta que tenga. Document Revised: 04/16/2019 Document Reviewed: 04/16/2019 Elsevier Patient Education  2021 Elsevier Inc.  

## 2020-11-03 NOTE — MAU Provider Note (Signed)
History     CSN: [id number]  Arrival date and time: 11/03/20 0124   Event Date/Time   First Provider Initiated Contact with Patient 11/03/20 0220      Chief Complaint  Patient presents with  . Vaginal Itching  . Leg Pain   34 y.o. [id number] @33 .1 wks presenting with vaginal itching and left hip pain. Vaginal itching started 3 days ago. Reports yellowish discharge. Left hip pain started yesterday. Hurts to walk at times. No recent injury. Has not tried anything for the pain. Denies abd pain, ctx, VB, and LOF. Reports good FM.   OB History    Gravida  4   Para  2   Term  2   Preterm      AB  1   Living  2     SAB  1   IAB      Ectopic      Multiple      Live Births  2           Past Medical History:  Diagnosis Date  . Hx of chlamydia infection 04/2020    History reviewed. No pertinent surgical history.  History reviewed. No pertinent family history.  Social History   Tobacco Use  . Smoking status: Never Smoker  . Smokeless tobacco: Never Used  Vaping Use  . Vaping Use: Never used  Substance Use Topics  . Alcohol use: Never  . Drug use: Never    Allergies: No Known Allergies  Medications Prior to Admission  Medication Sig Dispense Refill Last Dose  . Prenatal Vit-Fe Fumarate-FA (PRENATAL MULTIVITAMIN) TABS tablet Take 1 tablet by mouth daily at 12 noon.       Review of Systems  Gastrointestinal: Negative for abdominal pain.  Genitourinary: Positive for vaginal discharge. Negative for vaginal bleeding.  Musculoskeletal: Positive for arthralgias.   Physical Exam   Blood pressure 101/61, pulse 94, resp. rate 15, last menstrual period 03/22/2020, SpO2 100 %.  Physical Exam Vitals and nursing note reviewed. Exam conducted with a chaperone present.  Constitutional:      General: She is not in acute distress.    Appearance: Normal appearance.  HENT:     Head: Normocephalic and atraumatic.  Cardiovascular:     Rate and Rhythm: Normal  rate.  Pulmonary:     Effort: Pulmonary effort is normal. No respiratory distress.  Abdominal:     Palpations: Abdomen is soft.     Tenderness: There is no abdominal tenderness.  Genitourinary:    Comments: External: no lesions or erythema Vagina: rugated, pink, moist, curdy yellow discharge Cervix closed/thick  Musculoskeletal:        General: Normal range of motion.     Cervical back: Normal range of motion.  Skin:    General: Skin is warm and dry.  Neurological:     General: No focal deficit present.     Mental Status: She is alert and oriented to person, place, and time.  Psychiatric:        Mood and Affect: Mood normal.        Behavior: Behavior normal.   EFM: 125 bpm, mod variability, + accels, no decels Toco: irreg  Results for orders placed or performed during the hospital encounter of 11/03/20 (from the past 24 hour(s))  Wet prep, genital     Status: Abnormal   Collection Time: 11/03/20  2:35 AM   Specimen: PATH Cytology Cervicovaginal Ancillary Only  Result Value Ref Range   Yeast Wet Prep  HPF POC NONE SEEN NONE SEEN   Trich, Wet Prep NONE SEEN NONE SEEN   Clue Cells Wet Prep HPF POC NONE SEEN NONE SEEN   WBC, Wet Prep HPF POC MANY (A) NONE SEEN   Sperm NONE SEEN   Urinalysis, Routine w reflex microscopic Urine, Clean Catch     Status: Abnormal   Collection Time: 11/03/20  2:43 AM  Result Value Ref Range   Color, Urine STRAW (A) YELLOW   APPearance CLEAR CLEAR   Specific Gravity, Urine 1.004 (L) 1.005 - 1.030   pH 7.0 5.0 - 8.0   Glucose, UA NEGATIVE NEGATIVE mg/dL   Hgb urine dipstick NEGATIVE NEGATIVE   Bilirubin Urine NEGATIVE NEGATIVE   Ketones, ur NEGATIVE NEGATIVE mg/dL   Protein, ur NEGATIVE NEGATIVE mg/dL   Nitrite NEGATIVE NEGATIVE   Leukocytes,Ua TRACE (A) NEGATIVE   RBC / HPF 0-5 0 - 5 RBC/hpf   WBC, UA 0-5 0 - 5 WBC/hpf   Bacteria, UA RARE (A) NONE SEEN   Squamous Epithelial / LPF 0-5 0 - 5   MAU Course  Procedures Tylenol  MDM Review  of records shows +Chlamydia earlier in the pregnancy, was treated twice. Labs ordered and reviewed. Ctx noted on toco, pt denies feeling, IVF ordered.  0405: ctx no longer seen on toco. Pt reports hip pain is improved. Will treat for yeast clinically. Stable for discharge home.   Assessment and Plan   1. [redacted] weeks gestation of pregnancy   2. NST (non-stress test) reactive   3. Yeast vaginitis    Discharge home Follow up at [facility] as scheduled Rx Terazol Tylenol prn  Allergies as of 11/03/2020   No Known Allergies     Medication List    TAKE these medications   prenatal multivitamin Tabs tablet Take 1 tablet by mouth daily at 12 noon.   terconazole 0.4 % vaginal cream Commonly known as: Terazol 7 Place 1 applicator vaginally at bedtime.       Donette Larry, CNM 11/03/2020, 4:22 AM

## 2020-11-11 ENCOUNTER — Encounter: Payer: Self-pay | Admitting: General Practice

## 2020-11-23 LAB — OB RESULTS CONSOLE GBS: GBS: NEGATIVE

## 2020-12-14 ENCOUNTER — Encounter (HOSPITAL_COMMUNITY): Payer: Self-pay | Admitting: Family Medicine

## 2020-12-14 ENCOUNTER — Other Ambulatory Visit: Payer: Self-pay

## 2020-12-14 ENCOUNTER — Inpatient Hospital Stay (HOSPITAL_COMMUNITY)
Admission: AD | Admit: 2020-12-14 | Discharge: 2020-12-14 | Disposition: A | Discharge status: Home / Self Care | Payer: Self-pay | Attending: Family Medicine | Admitting: Family Medicine

## 2020-12-14 DIAGNOSIS — Z3A39 39 weeks gestation of pregnancy: Secondary | ICD-10-CM | POA: Insufficient documentation

## 2020-12-14 DIAGNOSIS — O479 False labor, unspecified: Secondary | ICD-10-CM

## 2020-12-14 DIAGNOSIS — O471 False labor at or after 37 completed weeks of gestation: Secondary | ICD-10-CM | POA: Insufficient documentation

## 2020-12-14 NOTE — MAU Note (Signed)
..  Katherine Robles is a 35 y.o. at [redacted]w[redacted]d here in MAU reporting: contractions since last night. Reports that she feels flushed with contractions and has began to feel weak. +FM. Denies vaginal bleeding or leaking of fluid.  Pain score: 2/10 Vitals:   12/14/20 1925  BP: 102/70  Pulse: 82  Resp: 17  Temp: 97.9 F (36.6 C)  SpO2: 100%     FHT: monitors applied  Last SVE: closed

## 2020-12-14 NOTE — Progress Notes (Signed)
S: [name] is a 35 y.o. [phone] at [redacted]w[redacted]d  who presents to MAU today for labor evaluation.     Cervical exam by RN:  Dilation: 3.5 Effacement (%): 40 Cervical Position: Posterior Station: -3 Presentation: Vertex Exam by:: TLYTLE  Fetal Monitoring: Baseline: 130bpm Variability: mod Accelerations: present Decelerations: absent Contractions: q3-5 min  MDM Discussed patient with RN. NST reviewed.   A: SIUP at [redacted]w[redacted]d  False labor  P: Discharge home Labor precautions and kick counts included in AVS Patient to follow-up with OBGYN as scheduled  Patient may return to MAU as needed or when in labor   Alric Seton, MD 12/14/2020 9:25 PM

## 2020-12-14 NOTE — Discharge Instructions (Signed)
Rosen's Emergency Medicine: Concepts and Clinical Practice (9th ed., pp. 2296- 2312). Elsevier.">  Contracciones de [employer] Braxton [employer] Las contracciones del tero pueden presentarse durante todo el [place], [place] no siempre indican que la mujer est de [place]. Es posible que usted haya tenido contracciones de prctica llamadas "contracciones de [place]". A veces, se las confunde con el parto real. Qu son las contracciones de [place]? Las contracciones de [place] son espasmos que se producen en los msculos del tero antes del [place]. A diferencia de las contracciones del parto verdadero, estas no producen el agrandamiento (la dilatacin) ni el afinamiento del cuello uterino. Hacia el final del embarazo [facility] las semanas [phone]), las contracciones de Braxton Hicks pueden presentarse ms seguido y tornarse ms intensas. A veces, resulta difcil distinguirlas del parto verdadero porque pueden ser [employer]. No debe sentirse avergonzada si concurre al hospital con falso parto. En ocasiones, la nica forma de saber si el trabajo de parto es verdadero es que el mdico determine si hay cambios en el cuello del tero. El V[employer]om har un examen fsico y Armed forces operational officer controle las contracciones. Si usted no est de Network engineer, el examen debe indicar que el cuello uterino no est dilatado y que usted no ha roto Financial trader. Si no hay otros problemas de salud asociados con su embarazo, no habr inconvenientes si la envan a su casa con un falso parto. Es posible que las contracciones de Braxton Hicks continen hasta que se desencadene el parto verdadero. Cmo diferenciar el [place] de parto falso del verdadero Trabajo de parto verdadero  Las contracciones [place].  Las contracciones pueden tornarse muy regulares.  La molestia generalmente se siente en la parte superior del tero y se extiende hacia la zona baja del abdomen y [employer] Corporation cintura.  Las  contracciones no desaparecen cuando usted camina.  Las contracciones generalmente se hacen ms intensas y Copywriter, advertising.  El cuello uterino se dilata y se afina. Parto falso  En general, las contracciones son ms cortas y no tan intensas como las del parto verdadero.  En general, las contracciones son irregulares.  A menudo, las contracciones se sienten en la parte delantera de la parte baja del abdomen y en la ingle.  Las Physiological scientist cuando usted camina o [place] de posicin mientras est [place].  Las contracciones se vuelven ms dbiles y su duracin es menor a medida que transcurre [employer].  En general, el cuello uterino no se dilata ni se afina. Siga estas indicaciones en su casa:  Tome los medicamentos de venta libre y los recetados solamente como se lo haya indicado el mdico.  Contine haciendo los ejercicios habituales y siga las dems indicaciones que el mdico le d.  Coma y beba con moderacin si cree que est de parto.  Si las contracciones de KeyCorp provocan incomodidad: ? Cambie de posicin: si est acostada o descansando, camine; si est caminando, descanse. ? Sintese y descanse en una baera con agua tibia. ? Beba suficiente lquido como para mantener la orina de color amarillo plido. La deshidratacin puede provocar contracciones. ? Respire lenta y profundamente varias veces por hora.  Vaya a todas las visitas de control prenatales y de control como se lo haya indicado el mdico. Esto es importante.   Comunquese con un mdico si:  Tiene fiebre.  Siente dolor constante en el abdomen. Solicite ayuda de inmediato si:  Las contracciones se intensifican, se hacen ms regulares  y cercanas entre sí. °· Tiene una pérdida de líquido por la vagina. °· Elimina una mucosidad sanguinolenta (pérdida del tapón mucoso). °· Tiene una hemorragia vaginal. °· Tiene un dolor en la zona lumbar que nunca tuvo antes. °· Siente que la  cabeza del bebé empuja hacia abajo y ejerce presión en la zona pélvica. °· El bebé no se mueve tanto como antes. °Resumen °· Las contracciones que se presentan antes del parto se conocen como contracciones de Braxton Hicks, falso parto o contracciones de práctica. °· En general, las contracciones de Braxton Hicks son más cortas, más débiles, con más tiempo entre una y otra, y menos regulares que las contracciones del parto verdadero. Las contracciones del parto verdadero se intensifican progresivamente y se tornan regulares y más frecuentes. °· Para controlar la molestia que producen las contracciones de Braxton Hicks, puede cambiar de posición, darse un baño templado y descansar, beber mucha agua o practicar la respiración profunda. °Esta información no tiene como fin reemplazar el consejo del médico. Asegúrese de hacerle al médico cualquier pregunta que tenga. °Document Revised: 11/10/2017 Document Reviewed: 03/13/2017 °Elsevier Patient Education © 2021 Elsevier Inc. ° °

## 2020-12-21 ENCOUNTER — Other Ambulatory Visit: Payer: Self-pay

## 2020-12-21 ENCOUNTER — Inpatient Hospital Stay (HOSPITAL_COMMUNITY)
Admission: AD | Admit: 2020-12-21 | Discharge: 2020-12-24 | DRG: 807 | Disposition: A | Discharge status: Home / Self Care | Payer: Medicaid Other | Attending: Family Medicine | Admitting: Family Medicine

## 2020-12-21 ENCOUNTER — Encounter (HOSPITAL_COMMUNITY): Payer: Self-pay | Admitting: Family Medicine

## 2020-12-21 DIAGNOSIS — O99355 Diseases of the nervous system complicating the puerperium: Secondary | ICD-10-CM | POA: Diagnosis not present

## 2020-12-21 DIAGNOSIS — Z3493 Encounter for supervision of normal pregnancy, unspecified, third trimester: Secondary | ICD-10-CM

## 2020-12-21 DIAGNOSIS — O480 Post-term pregnancy: Secondary | ICD-10-CM | POA: Diagnosis not present

## 2020-12-21 DIAGNOSIS — Z3A40 40 weeks gestation of pregnancy: Secondary | ICD-10-CM

## 2020-12-21 DIAGNOSIS — G8918 Other acute postprocedural pain: Secondary | ICD-10-CM | POA: Diagnosis not present

## 2020-12-21 DIAGNOSIS — O26893 Other specified pregnancy related conditions, third trimester: Secondary | ICD-10-CM | POA: Diagnosis present

## 2020-12-21 DIAGNOSIS — Z20822 Contact with and (suspected) exposure to covid-19: Secondary | ICD-10-CM | POA: Diagnosis present

## 2020-12-21 DIAGNOSIS — Z8759 Personal history of other complications of pregnancy, childbirth and the puerperium: Secondary | ICD-10-CM

## 2020-12-21 DIAGNOSIS — Z87828 Personal history of other (healed) physical injury and trauma: Secondary | ICD-10-CM

## 2020-12-21 LAB — RESP PANEL BY RT-PCR (FLU A&B, COVID) ARPGX2
Influenza A by PCR: NEGATIVE
Influenza B by PCR: NEGATIVE
SARS Coronavirus 2 by RT PCR: NEGATIVE

## 2020-12-21 LAB — CBC
HCT: 42.9 % (ref 36.0–46.0)
Hemoglobin: 13.8 g/dL (ref 12.0–15.0)
MCH: 28.2 pg (ref 26.0–34.0)
MCHC: 32.2 g/dL (ref 30.0–36.0)
MCV: 87.6 fL (ref 80.0–100.0)
Platelets: 161 K/uL (ref 150–400)
RBC: 4.9 MIL/uL (ref 3.87–5.11)
RDW: 14.7 % (ref 11.5–15.5)
WBC: 8.7 K/uL (ref 4.0–10.5)
nRBC: 0 % (ref 0.0–0.2)

## 2020-12-21 LAB — TYPE AND SCREEN
ABO/RH(D): O POS
Antibody Screen: NEGATIVE

## 2020-12-21 MED ORDER — OXYTOCIN-SODIUM CHLORIDE 30-0.9 UT/500ML-% IV SOLN
2.5000 [IU]/h | INTRAVENOUS | Status: DC
  Administered 2020-12-22: 2.5 [IU]/h via INTRAVENOUS
  Filled 2020-12-21: qty 500

## 2020-12-21 MED ORDER — LIDOCAINE HCL (PF) 1 % IJ SOLN
30.0000 mL | INTRAMUSCULAR | Status: AC | PRN
  Administered 2020-12-22: 30 mL via SUBCUTANEOUS
  Filled 2020-12-21: qty 30

## 2020-12-21 MED ORDER — LACTATED RINGERS IV SOLN: 500.0000 mL | INTRAVENOUS | Status: DC | PRN

## 2020-12-21 MED ORDER — OXYCODONE-ACETAMINOPHEN 5-325 MG PO TABS: 1.0000 | ORAL_TABLET | ORAL | Status: DC | PRN

## 2020-12-21 MED ORDER — OXYCODONE-ACETAMINOPHEN 5-325 MG PO TABS
2.0000 | ORAL_TABLET | ORAL | Status: DC | PRN
Start: 2020-12-21 — End: 2020-12-22

## 2020-12-21 MED ORDER — ACETAMINOPHEN 325 MG PO TABS: 650.0000 mg | ORAL_TABLET | ORAL | Status: DC | PRN

## 2020-12-21 MED ORDER — LACTATED RINGERS IV SOLN: INTRAVENOUS | Status: DC

## 2020-12-21 MED ORDER — ONDANSETRON HCL 4 MG/2ML IJ SOLN: 4.0000 mg | Freq: Four times a day (QID) | INTRAMUSCULAR | Status: DC | PRN

## 2020-12-21 MED ORDER — FENTANYL CITRATE (PF) 100 MCG/2ML IJ SOLN
100.0000 ug | INTRAMUSCULAR | Status: DC | PRN
  Administered 2020-12-22: 100 ug via INTRAVENOUS
  Filled 2020-12-21: qty 2

## 2020-12-21 MED ORDER — OXYTOCIN BOLUS FROM INFUSION
333.0000 mL | Freq: Once | INTRAVENOUS | Status: AC
  Administered 2020-12-22: 333 mL via INTRAVENOUS

## 2020-12-21 MED ORDER — SOD CITRATE-CITRIC ACID 500-334 MG/5ML PO SOLN: 30.0000 mL | ORAL | Status: DC | PRN

## 2020-12-21 NOTE — MAU Note (Signed)
Pt here for labor eval
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
## 2020-12-21 NOTE — Progress Notes (Signed)
Patient ID: Katherine Robles, female   DOB: [birth year], 35 y.o.   MRN: [id number] Patient is 6-7 cm AROM, thick mec Head is not well applied FHR:  Baseline: 130 bpm, Variability: Good {> 6 bpm), Accelerations: Reactive and Decelerations: Absent

## 2020-12-22 ENCOUNTER — Encounter (HOSPITAL_COMMUNITY): Payer: Self-pay | Admitting: Family Medicine

## 2020-12-22 DIAGNOSIS — Z87828 Personal history of other (healed) physical injury and trauma: Secondary | ICD-10-CM

## 2020-12-22 DIAGNOSIS — Z8759 Personal history of other complications of pregnancy, childbirth and the puerperium: Secondary | ICD-10-CM

## 2020-12-22 DIAGNOSIS — O480 Post-term pregnancy: Secondary | ICD-10-CM

## 2020-12-22 DIAGNOSIS — Z3A40 40 weeks gestation of pregnancy: Secondary | ICD-10-CM

## 2020-12-22 LAB — RPR: RPR Ser Ql: NONREACTIVE

## 2020-12-22 MED ORDER — WITCH HAZEL-GLYCERIN EX PADS: 1.0000 "application " | MEDICATED_PAD | CUTANEOUS | Status: DC | PRN

## 2020-12-22 MED ORDER — BENZOCAINE-MENTHOL 20-0.5 % EX AERO: 1.0000 "application " | INHALATION_SPRAY | CUTANEOUS | Status: DC | PRN

## 2020-12-22 MED ORDER — ONDANSETRON HCL 4 MG/2ML IJ SOLN: 4.0000 mg | INTRAMUSCULAR | Status: DC | PRN

## 2020-12-22 MED ORDER — TRANEXAMIC ACID-NACL 1000-0.7 MG/100ML-% IV SOLN
INTRAVENOUS | Status: AC
  Administered 2020-12-22: 1000 mg
  Filled 2020-12-22: qty 100

## 2020-12-22 MED ORDER — SIMETHICONE 80 MG PO CHEW
80.0000 mg | CHEWABLE_TABLET | ORAL | Status: DC | PRN
  Filled 2020-12-22: qty 1

## 2020-12-22 MED ORDER — DIPHENHYDRAMINE HCL 25 MG PO CAPS: 25.0000 mg | ORAL_CAPSULE | Freq: Four times a day (QID) | ORAL | Status: DC | PRN

## 2020-12-22 MED ORDER — ONDANSETRON HCL 4 MG PO TABS: 4.0000 mg | ORAL_TABLET | ORAL | Status: DC | PRN

## 2020-12-22 MED ORDER — ACETAMINOPHEN 325 MG PO TABS
650.0000 mg | ORAL_TABLET | Freq: Four times a day (QID) | ORAL | Status: DC
  Administered 2020-12-22 – 2020-12-24 (×9): 650 mg via ORAL
  Filled 2020-12-22 (×10): qty 2

## 2020-12-22 MED ORDER — PRENATAL MULTIVITAMIN CH
1.0000 | ORAL_TABLET | Freq: Every day | ORAL | Status: DC
  Administered 2020-12-22 – 2020-12-24 (×3): 1 via ORAL
  Filled 2020-12-22 (×3): qty 1

## 2020-12-22 MED ORDER — DIPHENHYDRAMINE HCL 50 MG/ML IJ SOLN
25.0000 mg | Freq: Once | INTRAMUSCULAR | Status: AC
  Administered 2020-12-22: 25 mg via INTRAVENOUS
  Filled 2020-12-22: qty 1

## 2020-12-22 MED ORDER — TERBUTALINE SULFATE 1 MG/ML IJ SOLN: 0.2500 mg | Freq: Once | INTRAMUSCULAR | Status: DC | PRN

## 2020-12-22 MED ORDER — DIBUCAINE (PERIANAL) 1 % EX OINT: 1.0000 "application " | TOPICAL_OINTMENT | CUTANEOUS | Status: DC | PRN

## 2020-12-22 MED ORDER — SENNOSIDES-DOCUSATE SODIUM 8.6-50 MG PO TABS
2.0000 | ORAL_TABLET | Freq: Every day | ORAL | Status: DC
  Administered 2020-12-23 – 2020-12-24 (×2): 2 via ORAL
  Filled 2020-12-22 (×2): qty 2

## 2020-12-22 MED ORDER — TETANUS-DIPHTH-ACELL PERTUSSIS 5-2.5-18.5 LF-MCG/0.5 IM SUSY: 0.5000 mL | PREFILLED_SYRINGE | Freq: Once | INTRAMUSCULAR | Status: DC

## 2020-12-22 MED ORDER — OXYTOCIN-SODIUM CHLORIDE 30-0.9 UT/500ML-% IV SOLN
1.0000 m[IU]/min | INTRAVENOUS | Status: DC
Start: 2020-12-22 — End: 2020-12-22
  Administered 2020-12-22: 2 m[IU]/min via INTRAVENOUS

## 2020-12-22 MED ORDER — COCONUT OIL OIL: 1.0000 "application " | TOPICAL_OIL | Status: DC | PRN

## 2020-12-22 MED ORDER — IBUPROFEN 600 MG PO TABS
600.0000 mg | ORAL_TABLET | Freq: Four times a day (QID) | ORAL | Status: DC
  Administered 2020-12-22 – 2020-12-24 (×9): 600 mg via ORAL
  Filled 2020-12-22 (×10): qty 1

## 2020-12-22 NOTE — Discharge Instructions (Signed)

## 2020-12-22 NOTE — Social Work (Signed)
CSW attempted to meet with MOB 2x. First time the was FOB assisting MOB with a phone call. Second time the  MOB and FOB soundly sleeping. CSW will attempt to see MOB at a different time.   Vivi Barrack, MSW, LCSW [facility]  Clinical Social Worker  [phone] 12/22/2020  2:34 PM

## 2020-12-22 NOTE — Progress Notes (Signed)
Pt with "tired, weak body" (~ 2 hours s/p vaginal delivery and IV Benadryl administration), unable to stay awake or keep eyes open for symptoms interview or education. FOB at bedside and awake/alert.   Basic safety teaching reviewed (falls, baby ABCs/ placement in crib, & bulb syringe uses & demonstration) with video interpreter Christiane Ha [phone], unable to review crib contents or feeding instructions at this time. After meds given to newborn, he returned to sleep without any hunger cues.    Marcelino Duster, staff RN, at bedside with this RN - to return for further assessment and pain meds. Pt in no apparent distress & denies additional questions or concerns at this time.  Elvia Collum, RN

## 2020-12-22 NOTE — Lactation Note (Signed)
This note was copied from a baby's chart. Lactation Consultation Note  Patient Name: Boy Hazell Siwik Today's Date: 12/22/2020 Reason for consult: Initial assessment;Term;Other (Comment) [facility] Spanish interpreter present, Brief visit - per [facility] baby has been spitty and mom desires to eat her breakfast. LC introduced herself to patient and FOB. LC encouraged mom to call with feeding cues. LC will F/U.) Age:34 hours  Maternal Data    Feeding Mother's Current Feeding Choice: Breast Milk and Formula  LATCH Score                    Lactation Tools Discussed/Used    Interventions    Discharge    Consult Status Consult Status: Follow-up Date: 12/22/20 Follow-up type: In-patient    Matilde Sprang Md Smola 12/22/2020, 9:08 AM

## 2020-12-22 NOTE — Lactation Note (Signed)
This note was copied from a baby's chart. Lactation Consultation Note  Patient Name: Katherine Robles Today's Date: 12/22/2020 Reason for consult: Term;Other (Comment) (called the [facility]  Spanish interpreter - busy with admission paperwork with this patient and will call when free to interprete for Lactation - baby 7 Hours old) Age:34 hours  Maternal Data    Feeding Mother's Current Feeding Choice: Breast Milk and Formula  LATCH Score                    Lactation Tools Discussed/Used    Interventions    Discharge    Consult Status Consult Status: Follow-up Date: 12/22/20 Follow-up type: In-patient    [name] 12/22/2020, 8:51 AM
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
## 2020-12-22 NOTE — Progress Notes (Signed)
Ordered pt breakfast, by Orlan Leavens Spanish Interpreter.
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
## 2020-12-22 NOTE — Lactation Note (Signed)
This note was copied from a baby's chart. Lactation Consultation Note  Patient Name: Katherine Robles Today's Date: 12/22/2020 - F/U  9 hours old  As LC entered the room, NP finishing up her exam and had the online interpreter  [place] on the line.  1st latch was after the NP exam and baby was clear from being stuffy, and rooting, LC offered to assist, mom hand expressed drops. LC had baby sucked the drops of a gloved finger and baby latched for 20 mins  2nd breast - was attempt due to being stuffy again gaggy.  LC recommended holding the baby upright for the stuffiness to clear.  Large wet diaper changed by LC.   Maternal Data Has patient been taught Hand Expression?: Yes  Feeding Mother's Current Feeding Choice: Breast Milk and Formula  LATCH Score Latch: Repeated attempts needed to sustain latch, nipple held in mouth throughout feeding, stimulation needed to elicit sucking reflex.  Audible Swallowing: None  Type of Nipple: Everted at rest and after stimulation  Comfort (Breast/Nipple): Soft / non-tender  Hold (Positioning): Assistance needed to correctly position infant at breast and maintain latch.  LATCH Score: 6   Lactation Tools Discussed/Used    Interventions Interventions: Breast feeding basics reviewed;Assisted with latch;Skin to skin;Breast massage;Breast compression;Adjust position;Support pillows;Position options;Education  Discharge WIC Program: Yes  Consult Status Consult Status: Follow-up Date: 12/22/20 Follow-up type: In-patient    Katherine Robles 12/22/2020, 11:14 AM

## 2020-12-23 DIAGNOSIS — G8918 Other acute postprocedural pain: Secondary | ICD-10-CM

## 2020-12-23 DIAGNOSIS — O99355 Diseases of the nervous system complicating the puerperium: Secondary | ICD-10-CM

## 2020-12-23 MED ORDER — IBUPROFEN 600 MG PO TABS: 600.0000 mg | ORAL_TABLET | Freq: Four times a day (QID) | ORAL | 0 refills | Status: DC

## 2020-12-23 MED ORDER — ACETAMINOPHEN 325 MG PO TABS: 650.0000 mg | ORAL_TABLET | Freq: Four times a day (QID) | ORAL | Status: DC | PRN

## 2020-12-23 MED ORDER — COCONUT OIL OIL: 1.0000 "application " | TOPICAL_OIL | 0 refills | Status: DC | PRN

## 2020-12-23 NOTE — Lactation Note (Signed)
This note was copied from a baby's chart. Lactation Consultation Note  Patient Name: Boy Terrionna Bridwell DJMEQ'A Date: 12/23/2020 Reason for consult: Follow-up assessment;Term Age:34 hours   P3 mother whose infant is now 10 hours old.  This is a term baby at 40+1 weeks.  Mother's feeding preference is breast/bottle.  Spanish interpreter, Kathlen Mody [phone]) used for interpretation.  Mother was anticipating discharge today, however, baby will need to stool prior to discharge.  Mother had no questions/concerns related to breast feeding.  She focused the entire visit around formula.  Mother concerned about getting formula to take home, how much to feed, how long it is good for once opened, asking what type she needs, asking how to get more for her to keep in the room (already had 3 bottles at bedside).  I reviewed the entire process regarding formula and bottle feeding.  Mother has a [facility] appointment on Friday.  RN updated and will continue to observe for a stool.  Mother can call for breast feeding assistance as needed.    Maternal Data    Feeding Mother's Current Feeding Choice: Breast Milk and Formula Nipple Type: Slow - flow  LATCH Score                    Lactation Tools Discussed/Used    Interventions    Discharge WIC Program: Yes  Consult Status Consult Status: Follow-up Date: 12/24/20 Follow-up type: In-patient    Dora Sims 12/23/2020, 6:54 PM

## 2020-12-24 NOTE — Lactation Note (Signed)
This note was copied from a baby's chart. Lactation Consultation Note  Patient Name: Katherine Robles NUUVO'Z Date: 12/24/2020 Reason for consult: Follow-up assessment Age:35 hours  P3 mother whose infant is now 41 hours old.  This is a term baby at 40+1 weeks.  Mother has breast feeding experience with her other two children.  Her feeding preference is breast/bottle.  Baby has been discharged.  Spanish interpreter used for discharge follow up.  Reviewed the importance of breast feeding prior to giving any formula supplementation.  Mother had hoped to be discharged yesterday, however, baby had not stooled.  At 0200 it was noted that baby was able to stool.  Reviewed milk "coming to volume" and stressed the importance of continuing to breast feed whenever baby shows cues.  Encouraged more breast feeding and less formula supplementation.  Mother verbalized understanding.    Mother has a manual pump for home use.  She is a [facility] participant and I encouraged her to contact the breast feeding counselors as needed for lactation assistance.  Father present.     Maternal Data    Feeding Nipple Type: Slow - flow  LATCH Score                    Lactation Tools Discussed/Used    Interventions Interventions: Education  Discharge Pump: Manual WIC Program: Yes  Consult Status Consult Status: Complete Date: 12/24/20 Follow-up type: Call as needed    Irene Pap Errik Mitchelle 12/24/2020, 8:37 AM
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
## 2021-01-24 ENCOUNTER — Inpatient Hospital Stay (HOSPITAL_COMMUNITY)
Admission: AD | Admit: 2021-01-24 | Discharge: 2021-01-24 | Disposition: A | Discharge status: Home / Self Care | Payer: Medicaid Other | Attending: Obstetrics and Gynecology | Admitting: Obstetrics and Gynecology

## 2021-01-24 ENCOUNTER — Other Ambulatory Visit: Payer: Self-pay

## 2021-01-24 ENCOUNTER — Encounter (HOSPITAL_COMMUNITY): Payer: Self-pay | Admitting: Obstetrics and Gynecology

## 2021-01-24 DIAGNOSIS — M79606 Pain in leg, unspecified: Secondary | ICD-10-CM | POA: Insufficient documentation

## 2021-01-24 DIAGNOSIS — R509 Fever, unspecified: Secondary | ICD-10-CM | POA: Insufficient documentation

## 2021-01-24 DIAGNOSIS — M79602 Pain in left arm: Secondary | ICD-10-CM | POA: Insufficient documentation

## 2021-01-24 DIAGNOSIS — Z791 Long term (current) use of non-steroidal anti-inflammatories (NSAID): Secondary | ICD-10-CM | POA: Insufficient documentation

## 2021-01-24 DIAGNOSIS — M791 Myalgia, unspecified site: Secondary | ICD-10-CM | POA: Insufficient documentation

## 2021-01-24 DIAGNOSIS — O9122 Nonpurulent mastitis associated with the puerperium: Secondary | ICD-10-CM | POA: Insufficient documentation

## 2021-01-24 DIAGNOSIS — M79601 Pain in right arm: Secondary | ICD-10-CM | POA: Insufficient documentation

## 2021-01-24 MED ORDER — CEPHALEXIN 500 MG PO CAPS: 500.0000 mg | ORAL_CAPSULE | Freq: Four times a day (QID) | ORAL | 0 refills | Status: AC

## 2021-01-24 MED ORDER — IBUPROFEN 800 MG PO TABS: 800.0000 mg | ORAL_TABLET | Freq: Three times a day (TID) | ORAL | 0 refills | Status: DC | PRN

## 2021-01-24 MED ORDER — IBUPROFEN 800 MG PO TABS
800.0000 mg | ORAL_TABLET | Freq: Once | ORAL | Status: AC
  Administered 2021-01-24: 800 mg via ORAL
  Filled 2021-01-24: qty 1

## 2021-01-24 MED ORDER — CEPHALEXIN 500 MG PO CAPS
500.0000 mg | ORAL_CAPSULE | Freq: Once | ORAL | Status: AC
  Administered 2021-01-24: 500 mg via ORAL
  Filled 2021-01-24: qty 1

## 2021-01-24 NOTE — MAU Provider Note (Signed)
History     CSN: [id number]  Arrival date and time: 01/24/21 1331   Event Date/Time   First Provider Initiated Contact with Patient 01/24/21 1404      Chief Complaint  Patient presents with   Breast Pain   Fever   Katherine Robles is a 35 y.o. [id number] who is approx 4 weeks PP. She presents today with fever and breast pain. She also reports pain in her arms and legs. She states that the fever started 01/23/2021. She has tried tylenol and ibuprofen this morning. She reports that she is breastfeeding.   Fever  This is a new problem. The current episode started yesterday. The problem has been unchanged. The maximum temperature noted was 100 to 100.9 F. The temperature was taken using an oral thermometer. Associated symptoms comments: Breast pain and redness Myalgias . She has tried acetaminophen and NSAIDs for the symptoms. The treatment provided mild relief.   OB History     Gravida  4   Para  3   Term  3   Preterm      AB  1   Living  3      SAB  1   IAB      Ectopic      Multiple  1   Live Births  3           Past Medical History:  Diagnosis Date   Hx of chlamydia infection 04/2020    Past Surgical History:  Procedure Laterality Date   NO PAST SURGERIES      No family history on file.  Social History   Tobacco Use   Smoking status: Never   Smokeless tobacco: Never  Vaping Use   Vaping Use: Never used  Substance Use Topics   Alcohol use: Never   Drug use: Never    Allergies: No Known Allergies  Medications Prior to Admission  Medication Sig Dispense Refill Last Dose   acetaminophen (TYLENOL) 325 MG tablet Take 2 tablets (650 mg total) by mouth every 6 (six) hours as needed for mild pain, moderate pain, fever or headache.      coconut oil OIL Apply 1 application topically as needed (nipple pain).  0    ibuprofen (ADVIL) 600 MG tablet Take 1 tablet (600 mg total) by mouth every 6 (six) hours. 30 tablet 0    Prenatal Vit-Fe Fumarate-FA  (PRENATAL MULTIVITAMIN) TABS tablet Take 1 tablet by mouth daily at 12 noon.       Review of Systems  Constitutional:  Positive for fever.  All other systems reviewed and are negative. Physical Exam   Blood pressure 102/62, pulse (!) 104, temperature (!) 100.8 F (38.2 C), temperature source Oral, resp. rate 17, SpO2 98 %, unknown if currently breastfeeding.  Physical Exam Vitals and nursing note reviewed.  Constitutional:      General: She is not in acute distress. HENT:     Head: Normocephalic.  Eyes:     Pupils: Pupils are equal, round, and reactive to light.  Cardiovascular:     Rate and Rhythm: Normal rate.  Pulmonary:     Effort: Pulmonary effort is normal.  Chest:  Breasts:    Right: Swelling, skin change and tenderness present.     Left: Normal.    Abdominal:     Palpations: Abdomen is soft.     Tenderness: There is no abdominal tenderness.  Musculoskeletal:        General: No swelling. Normal range of motion.  Right lower leg: No edema.     Left lower leg: No edema.  Skin:    General: Skin is warm and dry.  Neurological:     Mental Status: She is alert and oriented to person, place, and time.  Psychiatric:        Mood and Affect: Mood normal.        Behavior: Behavior normal.    MAU Course  Procedures  MDM Patient given ibuprofen and first dose of antibiotic here. RX for Keflex sent to pharmacy   Assessment and Plan   1. Mastitis, postpartum    DC home in stable condition  Comfort measures reviewed  RX: keflex 500mg  QID x 10 day, ibuprofen 800mg  Q8 hours PRN  Return to MAU as needed FU with OB as planned   Follow-up Information     Department, [facility] Follow up.   Contact information: [address] [place] [address] [place] [phone]                [id number] DNP, CNM  01/24/21  3:35 PM

## 2021-01-24 NOTE — MAU Note (Signed)
Pt reports to mau with c/o sore, and swollen breasts.  Pt states she has been unable to breast feed her baby today.  Pt states she last fed at 0300 today but pain has been there since yesterday.  Pt denies chest pain or SOB.  Reports chills at home since yesterday.  Temp 100.8 in triage.  Pt also reports left hip pain.

## 2021-04-30 ENCOUNTER — Telehealth: Payer: Self-pay

## 2021-04-30 NOTE — Telephone Encounter (Signed)
Pt called to report that she has recently gotten nexplanon implant, she was told to inform her pcp.   Pt also wanted to report that she has had a cough, headache, and ear pain since 9/12. Ear pain has gone away now, but she cannot hear out of her ear at all. Has been taking tylenol daily.   Pt already has appointment to establish 9/22

## 2021-05-02 ENCOUNTER — Emergency Department (HOSPITAL_COMMUNITY)
Admission: EM | Admit: 2021-05-02 | Discharge: 2021-05-02 | Disposition: A | Discharge status: Home / Self Care | Payer: Self-pay | Attending: Emergency Medicine | Admitting: Emergency Medicine

## 2021-05-02 ENCOUNTER — Other Ambulatory Visit: Payer: Self-pay

## 2021-05-02 ENCOUNTER — Encounter (HOSPITAL_COMMUNITY): Payer: Self-pay

## 2021-05-02 DIAGNOSIS — H66002 Acute suppurative otitis media without spontaneous rupture of ear drum, left ear: Secondary | ICD-10-CM

## 2021-05-02 DIAGNOSIS — H6692 Otitis media, unspecified, left ear: Secondary | ICD-10-CM | POA: Insufficient documentation

## 2021-05-02 MED ORDER — AMOXICILLIN 500 MG PO CAPS: 500.0000 mg | ORAL_CAPSULE | Freq: Three times a day (TID) | ORAL | 0 refills | Status: AC

## 2021-05-02 NOTE — Discharge Instructions (Signed)
Comunquese con un mdico si: Le sangra la nariz. Tiene un bulto en el cuello. No se siente mejor al cabo de 5 das. Empeora en lugar de mejorar. Solicite ayuda de inmediato si: Tiene dolor intenso que no puede controlar con medicamentos. Tiene hinchazn, enrojecimiento o dolor en el odo. Siente rigidez en el cuello. Una parte de su rostro no se mueve (paralizada). Le duele el hueso que se encuentra detrs del odo (mastoides) al tocarlo. Desarrolla un dolor de cabeza intenso.

## 2021-05-02 NOTE — ED Triage Notes (Signed)
Patient complains of congestion, frontal headache and left ear pain x 4 days. States that the pain is worse when she blows her nose

## 2021-05-02 NOTE — ED Provider Notes (Signed)
[facility] EMERGENCY DEPARTMENT Provider Note   CSN: [id number] Arrival date & time: 05/02/21  1427     History No chief complaint on file.   Katherine Robles is a 35 y.o. female who speaks spanish.  Professional translation services are utilized.  Patient complains of 2 days of left ear pain, left facial pain and nasal congestion.  She rates the pain as 8 out of 10.  She tried Tylenol without much relief.  She reports that her children were sick last week and she had a cold last week as well.  She describes the pain as pressure-like, throbbing and nonradiating.  She denies fever, neck stiffness, photophobia or headache  HPI     Past Medical History:  Diagnosis Date   Hx of chlamydia infection 04/2020    Patient Active Problem List   Diagnosis Date Noted   Vaginal delivery 12/22/2020   History of IUFD 12/22/2020   History of trauma 12/22/2020   Supervision of low-risk pregnancy, third trimester 12/21/2020    Past Surgical History:  Procedure Laterality Date   NO PAST SURGERIES       OB History     Gravida  4   Para  3   Term  3   Preterm      AB  1   Living  3      SAB  1   IAB      Ectopic      Multiple  1   Live Births  3           No family history on file.  Social History   Tobacco Use   Smoking status: Never   Smokeless tobacco: Never  Vaping Use   Vaping Use: Never used  Substance Use Topics   Alcohol use: Never   Drug use: Never    Home Medications Prior to Admission medications   Medication Sig Start Date End Date Taking? Authorizing Provider  acetaminophen (TYLENOL) 325 MG tablet Take 2 tablets (650 mg total) by mouth every 6 (six) hours as needed for mild pain, moderate pain, fever or headache. 12/23/20   Sheila Oats, MD  coconut oil OIL Apply 1 application topically as needed (nipple pain). 12/23/20   Sheila Oats, MD  ibuprofen (ADVIL) 800 MG tablet Take 1 tablet (800 mg total) by mouth every  8 (eight) hours as needed. 01/24/21   Armando Reichert, CNM  Prenatal Vit-Fe Fumarate-FA (PRENATAL MULTIVITAMIN) TABS tablet Take 1 tablet by mouth daily at 12 noon.    [provider]    Allergies    Patient has no known allergies.  Review of Systems   Review of Systems  Constitutional:  Negative for chills and fever.  HENT:  Positive for congestion and ear pain. Negative for sore throat.   Respiratory:  Negative for cough.   Gastrointestinal:  Negative for nausea and vomiting.  Skin:  Negative for rash.    Physical Exam Updated Vital Signs BP 105/77 (BP Location: Left Arm)   Pulse 76   Temp 98.9 F (37.2 C) (Oral)   Resp 18   SpO2 100%   Physical Exam Vitals and nursing note reviewed.  Constitutional:      General: She is not in acute distress.    Appearance: She is well-developed. She is not diaphoretic.  HENT:     Head: Normocephalic and atraumatic.     Right Ear: Hearing, tympanic membrane, ear canal and external ear normal.  Left Ear: External ear normal. Tympanic membrane is bulging.     Ears:     Comments: Left ear with bulging, opaque TM consistent with suppurative AOM    Nose: Nose normal.     Mouth/Throat:     Mouth: Mucous membranes are moist.  Eyes:     General: No scleral icterus.    Conjunctiva/sclera: Conjunctivae normal.  Cardiovascular:     Rate and Rhythm: Normal rate and regular rhythm.     Heart sounds: Normal heart sounds. No murmur heard.   No friction rub. No gallop.  Pulmonary:     Effort: Pulmonary effort is normal. No respiratory distress.     Breath sounds: Normal breath sounds.  Abdominal:     General: Bowel sounds are normal. There is no distension.     Palpations: Abdomen is soft. There is no mass.     Tenderness: There is no abdominal tenderness. There is no guarding.  Musculoskeletal:     Cervical back: Normal range of motion.  Skin:    General: Skin is warm and dry.  Neurological:     Mental Status: She is alert  and oriented to person, place, and time.  Psychiatric:        Behavior: Behavior normal.    ED Results / Procedures / Treatments   Labs (all labs ordered are listed, but only abnormal results are displayed) Labs Reviewed - No data to display  EKG None  Radiology No results found.  Procedures Procedures   Medications Ordered in ED Medications - No data to display  ED Course  I have reviewed the triage vital signs and the nursing notes.  Pertinent labs & imaging results that were available during my care of the patient were reviewed by me and considered in my medical decision making (see chart for details).    MDM Rules/Calculators/A&P                          35 year old female with left ear pain.  She has acute otitis media on examination.  There is no evidence of mastoiditis, or meningitis.  Patient will be discharged with amoxicillin.  Discussed supportive care.  She appears otherwise appropriate for discharge.  I discussed return precautions with the patient. Final Clinical Impression(s) / ED Diagnoses Final diagnoses:  None    Rx / DC Orders ED Discharge Orders     None        Arthor Captain, PA-C 05/02/21 1701    Glendora Score, MD 05/02/21 2330

## 2021-05-06 ENCOUNTER — Other Ambulatory Visit: Payer: Self-pay | Admitting: Internal Medicine

## 2021-05-06 ENCOUNTER — Ambulatory Visit: Payer: Self-pay | Admitting: Internal Medicine

## 2021-05-06 ENCOUNTER — Other Ambulatory Visit: Payer: Self-pay

## 2021-05-06 ENCOUNTER — Encounter: Payer: Self-pay | Admitting: Internal Medicine

## 2021-05-06 VITALS — BP 94/68 | HR 80 | Resp 16 | Ht 58.75 in | Wt 138.0 lb

## 2021-05-06 DIAGNOSIS — H66002 Acute suppurative otitis media without spontaneous rupture of ear drum, left ear: Secondary | ICD-10-CM

## 2021-05-06 MED ORDER — PNV PRENATAL PLUS MULTIVITAMIN 27-1 MG PO TABS: ORAL_TABLET | ORAL | 11 refills | Status: DC

## 2021-05-06 NOTE — Progress Notes (Signed)
Subjective:    Patient ID: Katherine Robles, female   DOB: July 18, 1986, 35 y.o.   MRN: [id number]   HPI  Here to establish  Interpreted by [name]   Left ear pain started last Wednesday, 8 days ago.  Went to urgent care on 05/02/21.  Started Amoxicillin 500 mg on 05/03/21.  She is taking 3 times daily and is on the 4th day of treatment.  The pain is gone, but she is unable to hear well.  Feels like something stuffed in her ear.  Last day of pain was yesterday.  No drainage from ear.  Has never had an ear infection before.  Everyone in home about 1.5 weeks ago diagnosed with RSV.  She had URI congestion with rest of family at same time.  2.  Nursing her 80 month old son, [place], but stopped taking PNV a while ago.  Did not realize she should continue with nursing.   3.  Brings up intermittent epigastric pain for which she was treated when pregnant.  Comes and goes.  Does not have now. Brings up at end of visit and leaving.   Current Meds  Medication Sig   amoxicillin (AMOXIL) 500 MG capsule Take 1 capsule (500 mg total) by mouth 3 (three) times daily for 10 days.   No Known Allergies  Past Medical History:  Diagnosis Date   Hx of chlamydia infection 04/2020   Past Surgical History:  Procedure Laterality Date   NO PAST SURGERIES     Family History  Problem Relation Age of Onset   Deafness Brother        congenital.  No vocal commucation as well.   Family Status  Relation Name Status   Mother  Alive, age 73y   Father  Alive, age 83y   Sister  Alive   Sister  Alive   Sister  Alive   Sister  Alive   Brother  Alive, age 29y   Brother  Alive   Brother  Alive   Daughter Burt Knack, age 9y   Son Winifred Olive, age 4y   Son [employer], age 48m     Social History   Socioeconomic History   Marital status: Married    Spouse name: Melve Josue Lizabeth Leyden   Number of children: 3   Years of education: Not on file   Highest education level: 6th grade   Occupational History   Not on file  Tobacco Use   Smoking status: Never   Smokeless tobacco: Never  Vaping Use   Vaping Use: Never used  Substance and Sexual Activity   Alcohol use: Never   Drug use: Never   Sexual activity: Yes    Birth control/protection: Implant  Other Topics Concern   Not on file  Social History Narrative   Lives at home with husband, 3 children.   Social Determinants of Health   Financial Resource Strain: Low Risk    Difficulty of Paying Living Expenses: Not hard at all  Food Insecurity: No Food Insecurity   Worried About Programme researcher, broadcasting/film/video in the Last Year: Never true   Ran Out of Food in the Last Year: Never true  Transportation Needs: No Transportation Needs   Lack of Transportation (Medical): No   Lack of Transportation (Non-Medical): No  Physical Activity: Not on file  Stress: Not on file  Social Connections: Not on file  Intimate Partner Violence: Not At Risk   Fear of Current or Ex-Partner:  No   Emotionally Abused: No   Physically Abused: No   Sexually Abused: No      Review of Systems    Objective:   BP 94/68 (BP Location: Left Arm, Patient Position: Sitting, Cuff Size: Normal)   Pulse 80   Resp 16   Ht 4' 10.75" (1.492 m)   Wt 138 lb (62.6 kg)   Breastfeeding Yes   BMI 28.11 kg/m   Physical Exam NAD HEENT:  PERRL, EOMI, TMs without erythema or bulging.  A bit dull.  NT over left mastoid. Neck:  Supple, No adenopathy, no thyromegaly Chest:  CTA CV:  RRR without murmur or rub.  Radial Pulses normal and equal. LE:  No edema.   Assessment & Plan    Decreased hearing left ear/Left AOM:  Encouraged her to finish her 10 day course of Amoxicillin and then we will reassess if her hearing has not improved.  Likely eustachian tube dysfunction.  2.  Family support:  Sounded like husband called during evaluation and was angry with her.  Could not understand what he was saying.  Denies concern with him.  Monitor.  We will have  Tristan Schroeder going out to work with her on how to prepare infant cereal and other solids and how to advance with his development.   She agrees today for food deliveries with [employer].  3.  Epigastric pain:  to call if recurs.

## 2021-05-06 NOTE — Telephone Encounter (Signed)
Could we put her on the wait list--I see her appt got moved out to November.  Or on a Monday when we don't have a lot of patients

## 2021-06-25 ENCOUNTER — Ambulatory Visit: Payer: Self-pay | Admitting: Internal Medicine

## 2021-08-13 ENCOUNTER — Other Ambulatory Visit (INDEPENDENT_AMBULATORY_CARE_PROVIDER_SITE_OTHER): Payer: Self-pay

## 2021-08-13 ENCOUNTER — Other Ambulatory Visit: Payer: Self-pay

## 2021-08-13 DIAGNOSIS — R300 Dysuria: Secondary | ICD-10-CM

## 2021-08-13 LAB — POCT URINALYSIS DIPSTICK
Bilirubin, UA: NEGATIVE
Blood, UA: NEGATIVE
Glucose, UA: NEGATIVE
Ketones, UA: NEGATIVE
Leukocytes, UA: NEGATIVE
Nitrite, UA: NEGATIVE
Protein, UA: NEGATIVE
Spec Grav, UA: 1.015 (ref 1.010–1.025)
Urobilinogen, UA: 0.2 E.U./dL
pH, UA: 6 (ref 5.0–8.0)

## 2021-08-13 NOTE — Progress Notes (Signed)
UA done after pt complained of occasional burning during urination. UA did not show any abnormalities. Results and concerns were communicated with Dr. Delrae Alfred.  Patient notified of lab results and instructed to call the office to set an appointment if issue reoccurs/continues.

## 2021-09-20 ENCOUNTER — Ambulatory Visit: Payer: Self-pay | Admitting: Internal Medicine

## 2021-09-27 ENCOUNTER — Other Ambulatory Visit: Payer: Self-pay

## 2021-09-27 ENCOUNTER — Ambulatory Visit: Payer: Self-pay | Admitting: Internal Medicine

## 2021-09-27 DIAGNOSIS — K0889 Other specified disorders of teeth and supporting structures: Secondary | ICD-10-CM

## 2021-09-27 NOTE — Progress Notes (Signed)
° ° °  Subjective:    Patient ID: Katherine Robles, female   DOB: 07/08/1986, 36 y.o.   MRN: [id number]   HPI  Katherine Robles interprets  Here 15 minutes late Has had dental pain for about 1 week.  Right lower jaw.  No definite swelling or discharge from around the tooth/teeth.    Right lower molars.  Has taken Tylenol, which helps.      No outpatient medications have been marked as taking for the 09/27/21 encounter (Office Visit) with Julieanne Manson, MD.     No Known Allergies   Review of Systems    Objective:   There were no vitals taken for this visit.  Physical Exam NAD Mild rim of erythema around lingual gingiva of right molar which is the posterior anchor tooth for a bridge.  No swelling or discharge.   Neck:  No adenopathy Chest:  CTA CV:  RRR without murmur or rub.     Assessment & Plan   Dental pain:  referral to Dental Clinic. To call if pain worsens

## 2021-09-27 NOTE — Patient Instructions (Signed)
Ibuprofen 200 mg  2-4 pastillas con comida cada 6 horas a necesita.

## 2021-10-13 ENCOUNTER — Other Ambulatory Visit: Payer: Self-pay

## 2021-10-13 ENCOUNTER — Other Ambulatory Visit: Payer: Self-pay | Admitting: *Deleted

## 2021-10-13 DIAGNOSIS — N644 Mastodynia: Secondary | ICD-10-CM

## 2021-10-13 DIAGNOSIS — Z124 Encounter for screening for malignant neoplasm of cervix: Secondary | ICD-10-CM

## 2021-10-13 NOTE — Progress Notes (Signed)
Patient: Katherine Robles           ?Date of Birth: 1985-12-07           ?MRN: [id number] ?Visit Date: 10/13/2021 ?PCP: Julieanne Manson, MD ? ?Cervical Cancer Screening ?Do you smoke?: No ?Have you ever had or been told you have an allergy to latex products?: No ?Marital status: Single ?Date of last pap smear: 1-2 yrs ago (08/20/20) ?Date of last menstrual period:  (implant) ?Number of pregnancies: 4 ?Number of births: 3 ?Have you ever had any of the following? ?Hysterectomy: No ?Tubal ligation (tubes tied): No ?Abnormal bleeding: No ?Abnormal pap smear: Yes ?Venereal warts: No ?A sex partner with venereal warts: No ?A high risk* sex partner: No ? ?Cervical Exam ? 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 Per patient her most recent Pap smear is the only abnormal Pap smear she has had. Last Pap smear result is available in Epic. Let patient know if today's Pap smear is normal and HPV negative that her next Pap smear will be due in one year due to her history. Informed patient will call her within the next couple of weeks with results of her Pap smear by phone. Patient verbalized understanding. ? ? ?Spanish interpreter Natale Lay from [facility] provided. ? ?Patient's History ?Patient Active Problem List  ? Diagnosis Date Noted  ? Vaginal delivery 12/22/2020  ? History of IUFD 12/22/2020  ? History of trauma 12/22/2020  ? Supervision of low-risk pregnancy, third trimester 12/21/2020  ? ?Past Medical History:  ?Diagnosis Date  ? Hx of chlamydia infection 04/2020  ?  ?Family History  ?Problem Relation Age of Onset  ? Deafness Brother   ?     congenital.  No vocal commucation as well.  ?  ?Social History  ? ?Occupational History  ? Not on file  ?Tobacco Use  ? Smoking status: Never  ? Smokeless tobacco: Never  ?Vaping Use  ?  Vaping Use: Never used  ?Substance and Sexual Activity  ? Alcohol use: Never  ? Drug use: Never  ? Sexual activity: Yes  ?  Birth control/protection: Implant  ? ?

## 2021-10-14 ENCOUNTER — Telehealth: Payer: Self-pay

## 2021-10-14 NOTE — Telephone Encounter (Signed)
Stomach pain. About 2 weeks. Reoccurring. Between hour or 2 at a time. Accompanied with dizziness and headache. Random. Nausea. Tea, has not help. No changes to diet ? ?Patient would like appt or recommendations for stomach pain. Has had reoccurring pain for about 2 weeks. Pain usually lasts for 1-2 hours at a time. Pain is daily. Sometimes accompanied with dizziness, headache, or nausea. Pain occurs at random (night/day/before and after eating). Has been taking teas, but does not feel that it has helped. Has not made any changes to diet. ?

## 2021-10-14 NOTE — Telephone Encounter (Signed)
Please put in as acute in next few days ?

## 2021-10-15 LAB — CYTOLOGY - PAP
Comment: NEGATIVE
Diagnosis: UNDETERMINED — AB
High risk HPV: POSITIVE — AB

## 2021-10-15 NOTE — Progress Notes (Signed)
        Needs colpo

## 2021-10-18 ENCOUNTER — Telehealth: Payer: Self-pay

## 2021-10-18 NOTE — Telephone Encounter (Signed)
Called patient via Katherine Robles, [name] to give pap smear results. Informed patient that pap smear showed ASCUS and HPV was positive. Based on this result the recommended FU is for a colpo. Explained to patient that in order for colpo to be covered she will need to come see [place] again in B[facility]CP. Will schedule patient's [facility]CP appointment once colpo is scheduled with [facility] for [employer] [facility]. Patient voiced understanding. Will call patient back with appoitment information once scheduled.  ?

## 2021-10-21 ENCOUNTER — Ambulatory Visit: Payer: Self-pay

## 2021-10-21 ENCOUNTER — Other Ambulatory Visit: Payer: Self-pay

## 2021-10-21 ENCOUNTER — Ambulatory Visit: Payer: Self-pay | Admitting: *Deleted

## 2021-10-21 ENCOUNTER — Ambulatory Visit
Admission: RE | Admit: 2021-10-21 | Discharge: 2021-10-21 | Disposition: A | Discharge status: Home / Self Care | Payer: No Typology Code available for payment source | Source: Ambulatory Visit | Attending: Obstetrics and Gynecology | Admitting: Obstetrics and Gynecology

## 2021-10-21 VITALS — BP 110/72 | Wt 138.9 lb

## 2021-10-21 DIAGNOSIS — Z1239 Encounter for other screening for malignant neoplasm of breast: Secondary | ICD-10-CM

## 2021-10-21 DIAGNOSIS — R87610 Atypical squamous cells of undetermined significance on cytologic smear of cervix (ASC-US): Secondary | ICD-10-CM

## 2021-10-21 DIAGNOSIS — N644 Mastodynia: Secondary | ICD-10-CM

## 2021-10-21 NOTE — Progress Notes (Signed)
[name] is a 36 y.o. female who presents to [facility] clinic today with complaint of bilateral outer breast pain x 2 months that is greater within the left breast. Patient states the pain comes and goes.  ? ?Patient referred to B[facility]CP due to having an abnormal Pap smear at the free cervical cancer screening on 10/13/2021 that a colposcopy is recommended for follow up. ?  ?Pap Smear: Pap smear not completed today. Last Pap smear was 10/13/2021 at the free cervical cancer screening clinic and ASCUS with positive HPV. 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 Last two Pap smear and colposcopy results are available in Epic. ?  ?Physical exam: ?Breasts ?Breasts symmetrical. No skin abnormalities bilateral breasts. No nipple retraction bilateral breasts. Patient is currently breastfeeding. No lymphadenopathy. No lumps palpated bilateral breasts. Complaints of right nipple area tenderness on exam. Complaints of left outer and axillary pain on exam.     ?  ?Pelvic/Bimanual ?Pap is not indicated today per BCCCP guidelines. ?  ?Smoking History: ?Patient has never smoked. ?  ?Patient Navigation: ?Patient education provided. Access to services provided for patient through [place] program. Spanish interpreter Rudene Anda from [facility] provided.  ?  ?Breast and Cervical Cancer Risk Assessment: ?Patient does not have family history of breast cancer, known genetic mutations, or radiation treatment to the chest before age 44. Patient has history of cervical dysplasia. Patient has no history of being immunocompromised or DES exposure in-utero. ? ?Risk Assessment   ? ? Risk Scores   ? ?   10/21/2021 09/15/2020  ? Last edited by: Royston Bake, CMA [place], Karlton Lemon, LPN  ? 5-year risk:    ? Lifetime risk:    ? ?  ?  ? ?  ? ? ?A: ?BCCCP exam without pap  smear ?Complaint of bilateral breast pain. ? ?P: ?Referred patient to the [facility] for a diagnostic mammogram. Appointment scheduled Thursday, October 21, 2021 at 1400. ? ?Referred patient to the [facility] [place] for a colposcopy to follow up for her abnormal Pap smear. Appointment scheduled Friday, Dec 17, 2021 at 0955. ? ?Katherine Parish, RN ?10/21/2021 12:22 PM   ?

## 2021-10-21 NOTE — Patient Instructions (Signed)
Explained breast self awareness with Katherine Robles. Patient did not need a Pap smear today due to last Pap smear was 10/13/2021. Explained the colposcopy the recommended follow up for her abnormal Pap smear. Referred patient to the [facility] for a colposcopy to follow up for her abnormal Pap smear. Appointment scheduled Friday, Dec 17, 2021 at 0955. Referred patient to the [facility] for a diagnostic mammogram. Appointment scheduled Thursday, October 21, 2021 at 1400. Patient aware of appointments and will be there. Katherine Robles verbalized understanding. ? ?Trenise Turay, Kathaleen Maser, RN ?12:22 PM ? ? ? ? ?

## 2021-10-25 ENCOUNTER — Ambulatory Visit: Payer: Self-pay | Admitting: Internal Medicine

## 2021-10-25 ENCOUNTER — Other Ambulatory Visit: Payer: Self-pay

## 2021-10-25 ENCOUNTER — Encounter: Payer: Self-pay | Admitting: Internal Medicine

## 2021-10-25 VITALS — BP 110/70 | HR 92 | Resp 16 | Ht 58.75 in | Wt 139.0 lb

## 2021-10-25 DIAGNOSIS — R1033 Periumbilical pain: Secondary | ICD-10-CM

## 2021-10-25 MED ORDER — FAMOTIDINE 40 MG PO TABS: 40.0000 mg | ORAL_TABLET | Freq: Every day | ORAL | 4 refills | Status: DC

## 2021-10-25 NOTE — Progress Notes (Signed)
    Subjective:    Patient ID: Katherine Robles, female   DOB: 10/20/1985, 36 y.o.   MRN: [id number]   HPI  Katherine Robles interprets  Here for an acute visit for abdominal pain, which has been a problem for 3 weeks.  Points to just below the epigastric area in midline.  Estimates she has the pain 3-4 days out of each week since started.   Describes pain as gas and cramping--feels like everything is moving around in her abdomen.   When pain starts, feels need to have a BM.  Has BM that is loose, but does not relieve the pain.  Passing flatus does not help with the pain.   Pain may last all day.   No melena or hematochezia. No weight loss. Not clear if she is stressed about anything.   Does get acid in throat with many different foods.    Current Meds  Medication Sig   etonogestrel (NEXPLANON) 68 MG IMPL implant 1 each by Subdermal route once. Placed 712/22 at [facility].   Prenatal Vit-Fe Fumarate-FA (PNV PRENATAL PLUS MULTIVITAMIN) 27-1 MG TABS 1 tab by mouth daily   No Known Allergies   Review of Systems    Objective:   BP 110/70 (BP Location: Right Arm, Patient Position: Sitting, Cuff Size: Normal)   Pulse 92   Resp 16   Ht 4' 10.75" (1.492 m)   Wt 139 lb (63 kg)   Breastfeeding Yes   BMI 28.31 kg/m   Physical Exam NAD HEENT:  PERRL, EOMI, throat without injection Neck:  Supple, No adenopathy Lungs:  CTA CV:  RRR without murmur or rub.  Radial pulses normal and equal Abomen:  Soft, NT, No HSM or mass, + BS.  Assessment & Plan   Abdominal pain, loose stools:  CBC, CMP.  Start Famotidine 40 mg daily as does have a component of GERD and will see if symptoms resolve.

## 2021-10-26 LAB — CBC WITH DIFFERENTIAL/PLATELET
Basophils Absolute: 0 10*3/uL (ref 0.0–0.2)
Basos: 0 %
EOS (ABSOLUTE): 0.1 10*3/uL (ref 0.0–0.4)
Eos: 1 %
Hematocrit: 41.9 % (ref 34.0–46.6)
Hemoglobin: 13.5 g/dL (ref 11.1–15.9)
Immature Grans (Abs): 0 10*3/uL (ref 0.0–0.1)
Immature Granulocytes: 0 %
Lymphocytes Absolute: 2.6 10*3/uL (ref 0.7–3.1)
Lymphs: 41 %
MCH: 27.1 pg (ref 26.6–33.0)
MCHC: 32.2 g/dL (ref 31.5–35.7)
MCV: 84 fL (ref 79–97)
Monocytes Absolute: 0.4 10*3/uL (ref 0.1–0.9)
Monocytes: 7 %
Neutrophils Absolute: 3.2 10*3/uL (ref 1.4–7.0)
Neutrophils: 51 %
Platelets: 241 10*3/uL (ref 150–450)
RBC: 4.99 x10E6/uL (ref 3.77–5.28)
RDW: 13.7 % (ref 11.7–15.4)
WBC: 6.3 10*3/uL (ref 3.4–10.8)

## 2021-10-26 LAB — COMPREHENSIVE METABOLIC PANEL
ALT: 37 IU/L — ABNORMAL HIGH (ref 0–32)
AST: 22 IU/L (ref 0–40)
Albumin/Globulin Ratio: 1.6 (ref 1.2–2.2)
Albumin: 4.6 g/dL (ref 3.8–4.8)
Alkaline Phosphatase: 118 IU/L (ref 44–121)
BUN/Creatinine Ratio: 28 — ABNORMAL HIGH (ref 9–23)
BUN: 15 mg/dL (ref 6–20)
Bilirubin Total: <0.2 mg/dL (ref 0.0–1.2)
CO2: 22 mmol/L (ref 20–29)
Calcium: 9.6 mg/dL (ref 8.7–10.2)
Chloride: 103 mmol/L (ref 96–106)
Creatinine, Ser: 0.54 mg/dL — ABNORMAL LOW (ref 0.57–1.00)
Globulin, Total: 2.9 g/dL (ref 1.5–4.5)
Glucose: 106 mg/dL — ABNORMAL HIGH (ref 70–99)
Potassium: 3.8 mmol/L (ref 3.5–5.2)
Sodium: 141 mmol/L (ref 134–144)
Total Protein: 7.5 g/dL (ref 6.0–8.5)
eGFR: 124 mL/min/{1.73_m2} (ref >59)

## 2021-12-02 ENCOUNTER — Ambulatory Visit: Payer: Self-pay | Admitting: Internal Medicine

## 2021-12-07 ENCOUNTER — Other Ambulatory Visit: Payer: Self-pay

## 2021-12-07 DIAGNOSIS — R748 Abnormal levels of other serum enzymes: Secondary | ICD-10-CM

## 2021-12-07 DIAGNOSIS — R7309 Other abnormal glucose: Secondary | ICD-10-CM

## 2021-12-08 LAB — HEPATITIS B SURFACE ANTIBODY,QUALITATIVE: Hep B Surface Ab, Qual: NONREACTIVE

## 2021-12-08 LAB — HEPATITIS C ANTIBODY: Hep C Virus Ab: NONREACTIVE

## 2021-12-08 LAB — HEMOGLOBIN A1C
Est. average glucose Bld gHb Est-mCnc: 120 mg/dL
Hgb A1c MFr Bld: 5.8 % — ABNORMAL HIGH (ref 4.8–5.6)

## 2021-12-08 LAB — HEPATITIS A ANTIBODY, TOTAL: hep A Total Ab: POSITIVE — AB

## 2021-12-08 LAB — HEPATITIS B CORE ANTIBODY, TOTAL: Hep B Core Total Ab: NEGATIVE

## 2021-12-08 LAB — HEPATITIS B SURFACE ANTIGEN: Hepatitis B Surface Ag: NEGATIVE

## 2021-12-17 ENCOUNTER — Other Ambulatory Visit (HOSPITAL_COMMUNITY)
Admission: RE | Admit: 2021-12-17 | Discharge: 2021-12-17 | Disposition: A | Discharge status: Home / Self Care | Payer: No Typology Code available for payment source | Source: Ambulatory Visit | Attending: Family Medicine | Admitting: Family Medicine

## 2021-12-17 ENCOUNTER — Encounter: Payer: Self-pay | Admitting: Family Medicine

## 2021-12-17 ENCOUNTER — Ambulatory Visit (INDEPENDENT_AMBULATORY_CARE_PROVIDER_SITE_OTHER): Payer: Self-pay | Admitting: Family Medicine

## 2021-12-17 DIAGNOSIS — R87810 Cervical high risk human papillomavirus (HPV) DNA test positive: Secondary | ICD-10-CM

## 2021-12-17 DIAGNOSIS — R87610 Atypical squamous cells of undetermined significance on cytologic smear of cervix (ASC-US): Secondary | ICD-10-CM | POA: Insufficient documentation

## 2021-12-17 NOTE — Progress Notes (Signed)
? ? ?  GYNECOLOGY CLINIC COLPOSCOPY PROCEDURE NOTE ? ?36 y.o. [id number] here for colposcopy for pap finding of: ? ?Lab Results  ?Component Value Date  ? DIAGPAP (A) 10/13/2021  ?  - Atypical squamous cells of undetermined significance (ASC-US)  ? 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 ? ? ?Discussed role for HPV in cervical dysplasia, need for surveillance, nature of the procedure, and risks and benefits. ? ?Pregnancy test: Nexplanon in place >1 year ? ?No Known Allergies ? ?Patient given informed consent, signed copy in the chart, time out was performed.   ? ?Placed in lithotomy position. Cervix viewed with speculum and colposcope after application of acetic acid.  ? ?Colposcopy Adequacy ?Cervix fully visualized: Yes ? ?SCJ fully visualized: Yes ? ?Colposcopy Findings ?dense acetowhite lesion(s) noted at 5-8, 3, and 12 o'clock ? ?Corresponding biopsies were obtained.   ? ?ECC specimen was not obtained. ? ?All specimens were labeled and sent to pathology. ? ?Hemostatic measures: Pressure and Monsel's solution ? ?Complications: none ? ?Patient tolerated the procedure well. ? ?OBGyn Exam ? ?Colposcopy Impressions ?Low grade features ? ?Plan ?Treatment plan pending biopsy results, per patient preference they will be communicated by telephone. ? 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 ? ?Patient was given post procedure instructions.  Will follow up pathology and manage accordingly; patient will be contacted with results and recommendations.  Routine preventative health maintenance measures emphasized. ? ?Venora Maples, MD/MPH ?Attending Family Medicine Physician, Faculty Practice ?[facility] for [employer], [facility] ? ?

## 2021-12-17 NOTE — Patient Instructions (Signed)
Compression socks  ? ?Or  ? ?Compression stockings ? ?

## 2021-12-17 NOTE — Addendum Note (Signed)
Addended by: Isabell Jarvis on: 12/17/2021 10:57 AM ? ? Modules accepted: Orders ? ?

## 2021-12-20 ENCOUNTER — Encounter: Payer: Self-pay | Admitting: Family Medicine

## 2021-12-20 DIAGNOSIS — R87610 Atypical squamous cells of undetermined significance on cytologic smear of cervix (ASC-US): Secondary | ICD-10-CM | POA: Insufficient documentation

## 2021-12-20 LAB — SURGICAL PATHOLOGY

## 2021-12-21 ENCOUNTER — Telehealth: Payer: Self-pay

## 2021-12-21 NOTE — Telephone Encounter (Signed)
I called patient with the help of spanish interpreter Raquel. I reviewed the results of patient's biopsy with her. I gave patient Dr. Allen Derry recommendation to repeat pap/HPV testing in one year with BCCCP. Patient verbalized understanding and denies any other questions.  ? ?Katherine Fusi, RN ?12/21/21 ?

## 2021-12-21 NOTE — Telephone Encounter (Signed)
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 This means she just needs to repeat a pap with HPV testing in one year with BCCCP. Ideally her next pap should include reflex testing for HPV genotyping 16/18/45 as she may be able to avoid repeat colposcopy if none of those genotypes are involved.  ?

## 2021-12-28 ENCOUNTER — Ambulatory Visit: Payer: Self-pay | Admitting: Internal Medicine

## 2022-01-06 ENCOUNTER — Encounter: Payer: Self-pay | Admitting: Internal Medicine

## 2022-01-06 ENCOUNTER — Ambulatory Visit: Payer: Self-pay | Admitting: Internal Medicine

## 2022-01-06 VITALS — BP 106/68 | HR 76 | Resp 12 | Ht 58.75 in | Wt 141.0 lb

## 2022-01-06 DIAGNOSIS — I83813 Varicose veins of bilateral lower extremities with pain: Secondary | ICD-10-CM | POA: Insufficient documentation

## 2022-01-06 DIAGNOSIS — K219 Gastro-esophageal reflux disease without esophagitis: Secondary | ICD-10-CM

## 2022-01-06 DIAGNOSIS — R748 Abnormal levels of other serum enzymes: Secondary | ICD-10-CM

## 2022-01-06 DIAGNOSIS — R7303 Prediabetes: Secondary | ICD-10-CM

## 2022-01-06 NOTE — Progress Notes (Signed)
    Subjective:    Patient ID: Katherine Robles, female   DOB: [birth year], 36 y.o.   MRN: [id number]   HPI  Katherine Robles  Poor historian   GERD and loose stools:  Famotidine 40 mg daily prescribed, but stopped after first bottle, states she did not realize she could refill.   Did help with reflux symptoms when taking--was having daily previously and down to 2 times weekly or if she drank grape juice, would set it off.  After not taking again for about 6-7 weeks, having symptoms 2-3 times weekly.  Still with loose stools at times.    2.  Bilateral lower leg pain, which radiates down into her bilateral feet:  Has been bothering her for 1 year:  Related started with mastitis after delivery of Diego in June of last year.  Hurts the most late morning, after has been on feet for a while.  Has not taken anything for the pain.  She thinks her feet swell with the pain.    Current Meds  Medication Sig   Ascorbic Acid (VITAMIN C) 100 MG tablet Take 100 mg by mouth daily.   etonogestrel (NEXPLANON) 68 MG IMPL implant 1 each by Subdermal route once. [facility] 712/22 at [facility].   famotidine (PEPCID) 40 MG tablet Take 1 tablet (40 mg total) by mouth daily. 1 tab by mouth at bedtime   Prenatal Vit-Fe Fumarate-FA (PNV PRENATAL PLUS MULTIVITAMIN) 27-1 MG TABS 1 tab by mouth daily   No Known Allergies   Review of Systems    Objective:   BP 106/68 (BP Location: Left Arm, Patient Position: Sitting, Cuff Size: Normal)   Pulse 76   Resp 12   Ht 4' 10.75" (1.492 m)   Wt 141 lb (64 kg)   LMP  (LMP Unknown)   Breastfeeding Yes   BMI 28.72 kg/m   Physical Exam NAD Lungs:  CTA CV:  RRR without murmur or rub. Abd:  S, NT, No HSM or mass, + BS LE:  varicosities of bilateral LE.  No edema.   Assessment & Plan   GERD:  encouraged her to fill the Famotidine monthly.  GERD precaution handout.  2.  Varicose veins:  discussed good shoes, elevation of legs when sitting and to recline.  Ames Walker thigh  high compression stockings to wear daily as tolerated.  Went over sizing.    3.  Prediabetes:  discussed previously from April labs  4.  Mildly elevated ALT:  did not address hepatitis serology and negative for all--though appears to have been exposed to Hep A.  Needs vaccination for B

## 2022-01-12 ENCOUNTER — Other Ambulatory Visit: Payer: Self-pay

## 2022-01-12 MED ORDER — FAMOTIDINE 40 MG PO TABS: 40.0000 mg | ORAL_TABLET | Freq: Every day | ORAL | 4 refills | Status: DC

## 2022-03-02 ENCOUNTER — Telehealth: Payer: Self-pay

## 2022-03-02 NOTE — Telephone Encounter (Signed)
Patient called to report headache and dizziness that she has had since 02/25/2022. Headache is entire head and feels like pressure. Pain lasts for 3 hours at a time reoccurring everyday. Patient has taken tylenol, but it has not helped. Patient is concerned it is due to sugar levels

## 2022-03-03 NOTE — Telephone Encounter (Signed)
Patient reported that she is currently using nexplanon so patient was told to use 2-3 tabs of 200mg  ibuprofen every 6 hours as needed with meals. Patient instructed to call if headaches continue or worsen

## 2022-03-07 ENCOUNTER — Other Ambulatory Visit: Payer: Self-pay

## 2022-03-07 DIAGNOSIS — Z23 Encounter for immunization: Secondary | ICD-10-CM

## 2022-03-07 DIAGNOSIS — R748 Abnormal levels of other serum enzymes: Secondary | ICD-10-CM

## 2022-03-07 DIAGNOSIS — R7303 Prediabetes: Secondary | ICD-10-CM

## 2022-03-07 LAB — POCT GLUCOSE (DEVICE FOR HOME USE): POC Glucose: 115 mg/dl — AB (ref 70–99)

## 2022-03-08 LAB — HEPATIC FUNCTION PANEL
ALT: 21 IU/L (ref 0–32)
AST: 16 IU/L (ref 0–40)
Albumin: 4.6 g/dL (ref 3.9–4.9)
Alkaline Phosphatase: 102 IU/L (ref 44–121)
Bilirubin Total: <0.2 mg/dL (ref 0.0–1.2)
Bilirubin, Direct: <0.1 mg/dL (ref 0.00–0.40)
Total Protein: 7.4 g/dL (ref 6.0–8.5)

## 2022-05-12 ENCOUNTER — Ambulatory Visit: Payer: Self-pay | Admitting: Internal Medicine

## 2022-06-16 ENCOUNTER — Ambulatory Visit: Payer: Self-pay | Admitting: Internal Medicine

## 2022-08-11 ENCOUNTER — Ambulatory Visit: Payer: Self-pay | Admitting: Internal Medicine

## 2022-10-05 ENCOUNTER — Ambulatory Visit: Payer: Self-pay | Admitting: Internal Medicine

## 2022-10-05 ENCOUNTER — Encounter: Payer: Self-pay | Admitting: Internal Medicine

## 2022-10-05 VITALS — BP 118/80 | HR 80 | Resp 16 | Ht 58.75 in | Wt 146.0 lb

## 2022-10-05 DIAGNOSIS — H547 Unspecified visual loss: Secondary | ICD-10-CM

## 2022-10-05 DIAGNOSIS — R61 Generalized hyperhidrosis: Secondary | ICD-10-CM

## 2022-10-05 DIAGNOSIS — L84 Corns and callosities: Secondary | ICD-10-CM

## 2022-10-05 DIAGNOSIS — M79604 Pain in right leg: Secondary | ICD-10-CM

## 2022-10-05 DIAGNOSIS — I83813 Varicose veins of bilateral lower extremities with pain: Secondary | ICD-10-CM

## 2022-10-05 DIAGNOSIS — M79605 Pain in left leg: Secondary | ICD-10-CM

## 2022-10-05 MED ORDER — TEA TREE 100 % EX OIL: TOPICAL_OIL | CUTANEOUS | Status: AC

## 2022-10-05 NOTE — Patient Instructions (Signed)
Limpia pies con Dial jabon uno o dos veces al dia  Aplica aceta de arbol de tea con crema de Gold Bond Foot cream

## 2022-10-05 NOTE — Progress Notes (Signed)
    Subjective:    Patient ID: Katherine Robles, female   DOB: 12/18/1985, 37 y.o.   MRN: [id number]   HPI  Katherine Robles interprets   Visual issues:  Reading a lot at night.  Feels the light is good.  Vision gets blurry, she rubs her eyes and improves, but notes she is having headaches a lot. Difficulty seeing small print.  2.  Sweaty feet for the 2 years she has been here in [place].  Previously, she may have had the problem all along, but always blamed on being outside in the heat.  She is always inside now and still notes the problem.  Wearing plastic slides and notes socks get wet.  States has with any shoes she wears.  Difficulty getting her to describe the other types of shoes and materials.  Frustrated with the questions.  Has had fluffy slippers and had to stop wearing them as they started to smell. Using a powder on her feet for Odori.    3.  Bilateral leg pain:  Has been a problem for 18 months or more.  States she was told the pain in her legs was related to mastitis back in 01/2021 following birth of her boy.  She really only notes the pain when she is in the house   Always wears the plastic slides when in the home.  She states the slides she has now are only a month old, but she has purchased the same ones to wear regularly.    Current Meds  Medication Sig   Ascorbic Acid (VITAMIN C) 100 MG tablet Take 100 mg by mouth daily.   etonogestrel (NEXPLANON) 68 MG IMPL implant 1 each by Subdermal route once. Placed 712/22 at [facility].   No Known Allergies   Review of Systems    Objective:   BP 118/80 (BP Location: Right Arm, Patient Position: Sitting, Cuff Size: Normal)   Pulse 80   Resp 16   Ht 4' 10.75" (1.492 m)   Wt 146 lb (66.2 kg)   BMI 29.74 kg/m   Physical Exam NAD HEENT:  PERRL, EOMI, Discs sharp.  TMs pearly gray, throat without injection Neck:  Supple, No adenopathy Chest:  CTA CV:  RRR without murmur or rub.   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  No edema.  Very high arches of feet with significant callusing of posterior heel, edges of little and great toes as well as over medial and plantar aspect of great MTP joint bilaterally.  No obvious perspiration or wetness of feet currently.  Powder in between toes.     Assessment & Plan    Decreased visual acuity:  referral to optometry through [facility].    2.  Leg pain:  possible combination of varicosities and also poor footwear.  Her plastic slides do not have any support of her high arch.  Would like for her to look for a better pair of shoes and see if her leg pain improves as well.  The callusing on her feet also suggest her feet are moving about in the slides with friction at sites of callusing.    3.  Foot sweating:  have asked her to use Gold Bond Foot cream with Tea tree oil twice daily to feet and really massage into callused areas and plantar foot as well.

## 2022-10-14 DIAGNOSIS — M79604 Pain in right leg: Secondary | ICD-10-CM | POA: Insufficient documentation

## 2022-10-14 DIAGNOSIS — R61 Generalized hyperhidrosis: Secondary | ICD-10-CM | POA: Insufficient documentation

## 2022-10-14 DIAGNOSIS — H547 Unspecified visual loss: Secondary | ICD-10-CM | POA: Insufficient documentation

## 2022-10-14 DIAGNOSIS — L84 Corns and callosities: Secondary | ICD-10-CM | POA: Insufficient documentation

## 2022-12-29 ENCOUNTER — Other Ambulatory Visit: Payer: Self-pay

## 2022-12-29 ENCOUNTER — Ambulatory Visit: Payer: Self-pay | Admitting: Hematology and Oncology

## 2022-12-29 VITALS — BP 105/78 | Wt 146.0 lb

## 2022-12-29 DIAGNOSIS — Z1231 Encounter for screening mammogram for malignant neoplasm of breast: Secondary | ICD-10-CM

## 2022-12-29 DIAGNOSIS — Z01419 Encounter for gynecological examination (general) (routine) without abnormal findings: Secondary | ICD-10-CM

## 2022-12-29 DIAGNOSIS — Z124 Encounter for screening for malignant neoplasm of cervix: Secondary | ICD-10-CM

## 2022-12-29 NOTE — Patient Instructions (Signed)
Taught Katherine Robles about self breast awareness and gave educational materials to take home. Patient did need a Pap smear today due to last Pap smear was in 2023 per patient.Let her know [facility] will cover Pap smears every 5 years unless has a history of abnormal Pap smears. . Patient aware of appointment and will be there. Let patient know will follow up with her within the next couple weeks with results. Katherine Robles verbalized understanding.  Pascal Lux, NP 10:41 AM

## 2022-12-29 NOTE — Progress Notes (Signed)
Katherine Robles is a 37 y.o. [phone] female who presents to [facility] clinic today with no complaints.    Pap Smear: Pap smear completed today. Last Pap smear was 10/13/2021 and was abnormal - ASCUS/ HPV+ . Per patient has history of an abnormal Pap smear. Last Pap smear result is available in Epic. 08/20/2020 - ASCUS/ HPV+   Physical exam: Breasts Breasts symmetrical. No skin abnormalities bilateral breasts. No nipple retraction bilateral breasts. No nipple discharge bilateral breasts. No lymphadenopathy. No lumps palpated bilateral breasts.    MS DIGITAL DIAG TOMO BILAT  Result Date: 10/21/2021 CLINICAL DATA:  Nonfocal pain and pulling sensations in the lateral aspects of both breasts and extending into the axilla on the left for the past 2 months. She has been breastfeeding for the past 9 months. No family history of breast cancer. EXAM: DIGITAL DIAGNOSTIC BILATERAL MAMMOGRAM WITH TOMOSYNTHESIS AND CAD TECHNIQUE: Bilateral digital diagnostic mammography and breast tomosynthesis was performed. The images were evaluated with computer-aided detection. COMPARISON:  None. ACR Breast Density Category c: The breast tissue is heterogeneously dense, which may obscure small masses. FINDINGS: Mammographically normal appearing breasts with no findings suspicious for malignancy in either breast. IMPRESSION: No evidence of malignancy. RECOMMENDATION: Annual screening mammography beginning at age 5. I have discussed the findings and recommendations with the patient. If applicable, a reminder letter will be sent to the patient regarding the next appointment. BI-RADS CATEGORY  1: Negative. Electronically Signed   By: Beckie Salts M.D.   On: 10/21/2021 14:36      Pelvic/Bimanual Ext Genitalia No lesions, no swelling and no discharge observed on external genitalia.        Vagina Vagina pink and normal texture. No lesions or discharge observed in vagina.        Cervix Cervix is present. Cervix pink and of  normal texture. No discharge observed.    Uterus Uterus is present and palpable. Uterus in normal position and normal size.        Adnexae Bilateral ovaries present and palpable. No tenderness on palpation.         Rectovaginal No rectal exam completed today since patient had no rectal complaints. No skin abnormalities observed on exam.     Smoking History: Patient has never smoked and was not referred to quit line.    Patient Navigation: Patient education provided. Access to services provided for patient through [facility] program. Natale Lay interpreter provided. No transportation provided   Colorectal Cancer Screening: Per patient has never had colonoscopy completed No complaints today.    Breast and Cervical Cancer Risk Assessment: Patient does not have family history of breast cancer, known genetic mutations, or radiation treatment to the chest before age 66. Patient has history of cervical dysplasia, immunocompromised, or DES exposure in-utero.  Risk Assessment   No risk assessment data for the current encounter  Risk Scores       10/21/2021   Last edited by: Meryl Dare, CMA   5-year risk:    Lifetime risk:             A: [facility] exam with pap smear No complaints with benign exam.  P: Referred patient to [facility] for a diagnostic mammogram. Appointment scheduled 12/29/22.  Pascal Lux, NP 12/29/2022 10:17 AM

## 2023-01-04 LAB — CYTOLOGY - PAP
Adequacy: ABSENT
Comment: NEGATIVE
Diagnosis: NEGATIVE
High risk HPV: NEGATIVE

## 2023-04-19 IMAGING — MG DIGITAL DIAGNOSTIC BILAT W/ TOMO W/ CAD
8 series · 9 of 24 positions shown · non-contrast
Comparison: None.

CLINICAL DATA: Nonfocal pain and pulling sensations in the lateral
aspects of both breasts and extending into the axilla on the left
for the past 2 months. She has been breastfeeding for the past 9
months. No family history of breast cancer.

EXAM:
DIGITAL DIAGNOSTIC BILATERAL MAMMOGRAM WITH TOMOSYNTHESIS AND CAD
TECHNIQUE: Bilateral digital diagnostic mammography and breast tomosynthesis
was performed. The images were evaluated with computer-aided
detection.

[L CC synth-2D]
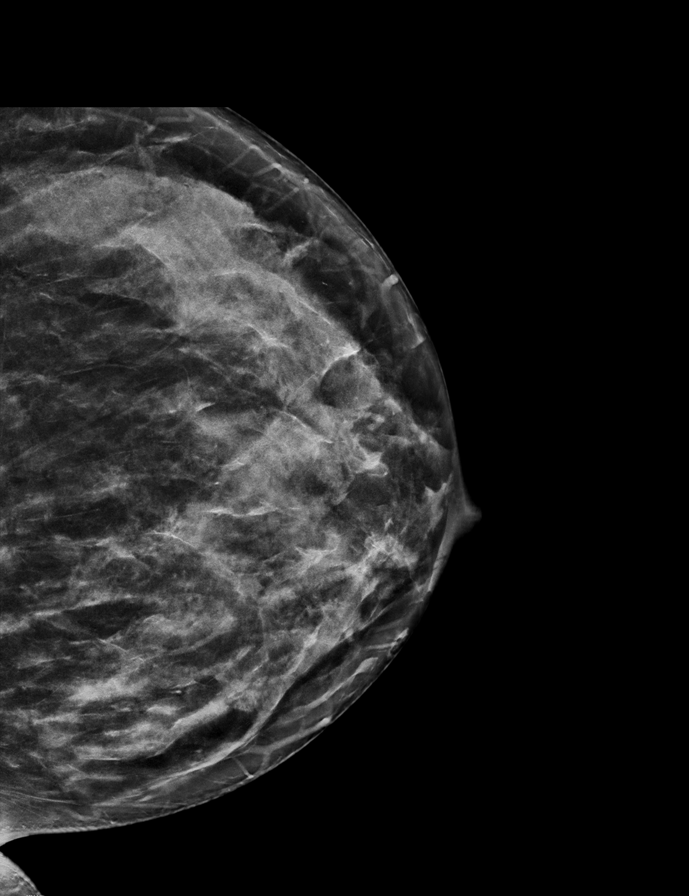

[R CC synth-2D]
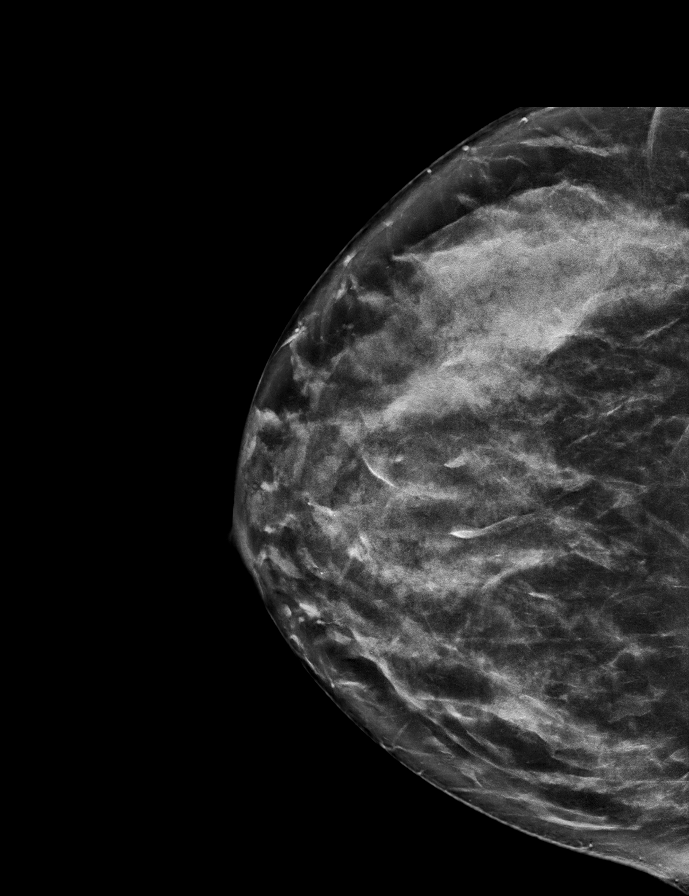

[L MLO synth-2D]
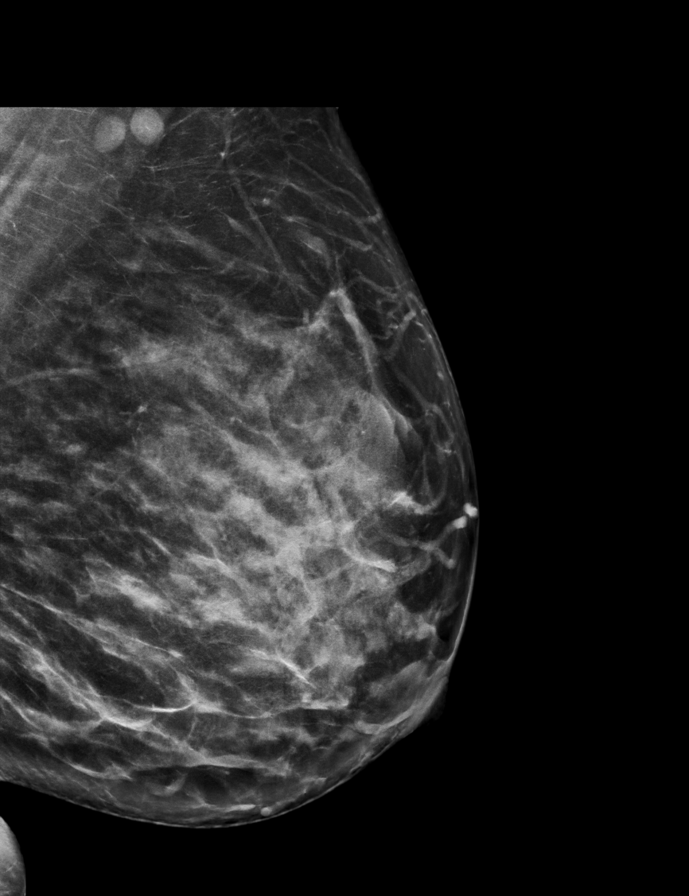

[R MLO synth-2D]
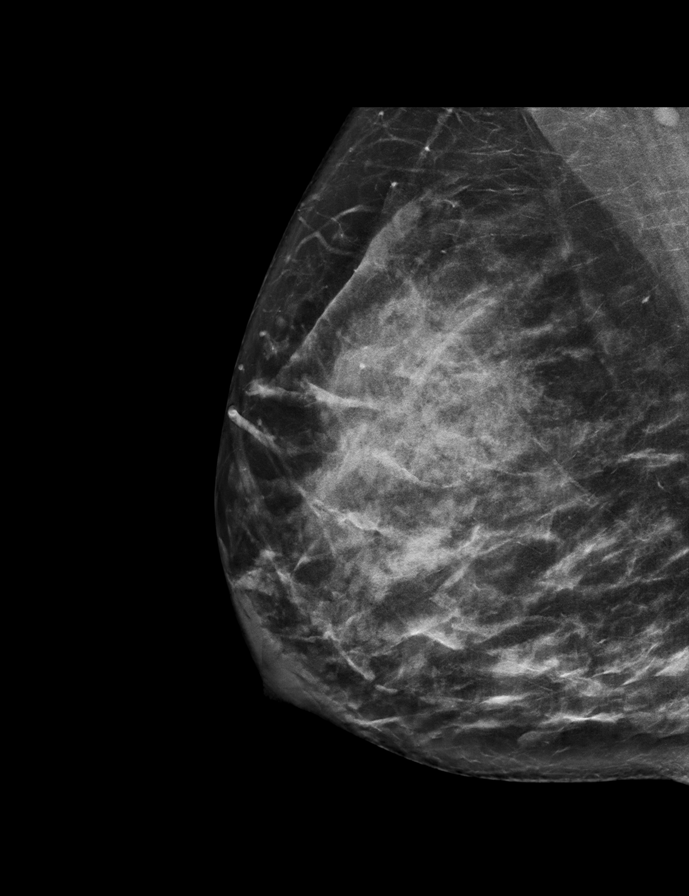

[R CC tomo · 2 of 75 frames shown]
[frame 25/75]
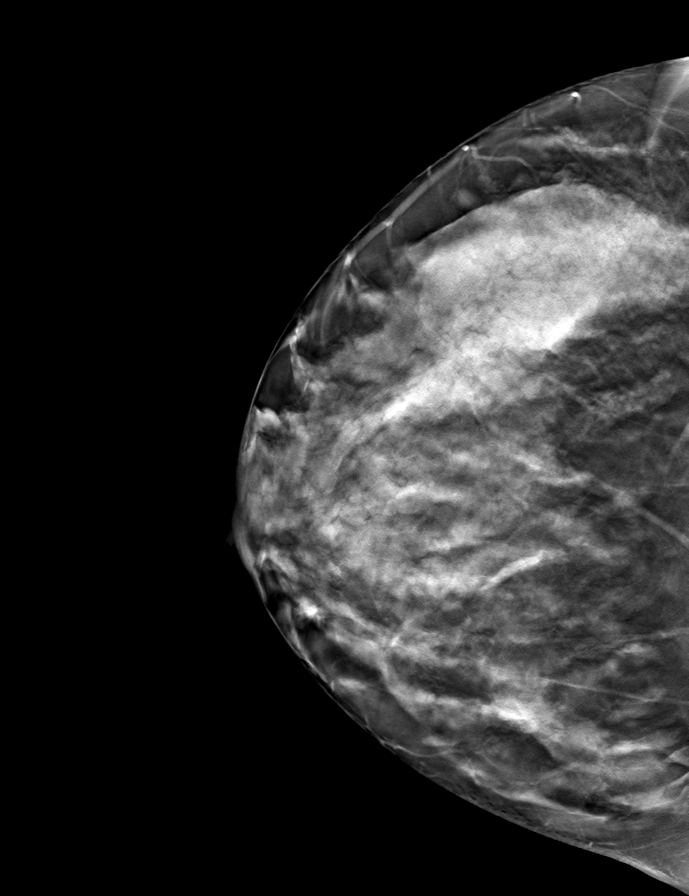
[frame 38/75]
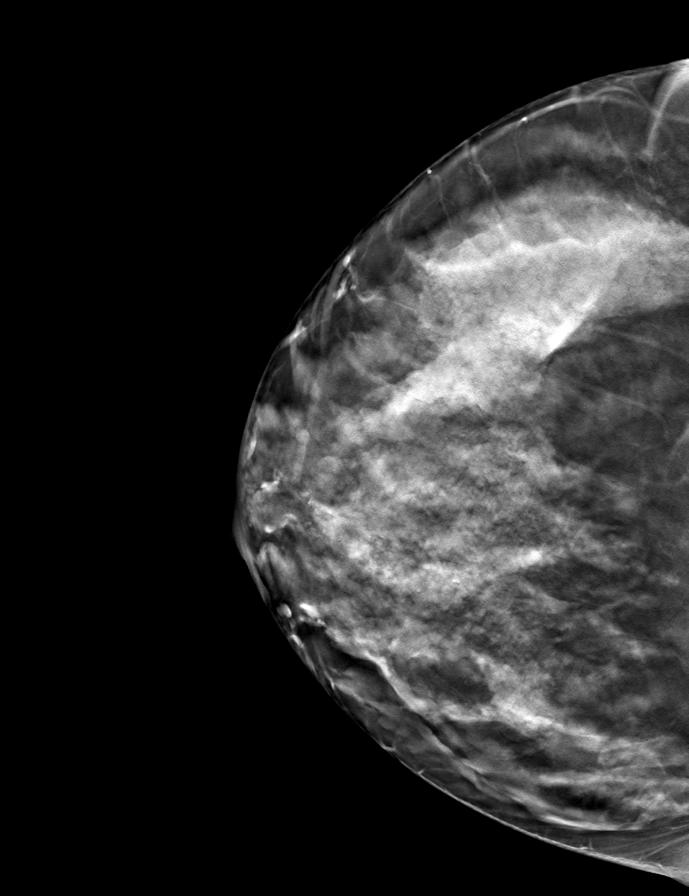

[L CC tomo · tomo slice 34/67.0]
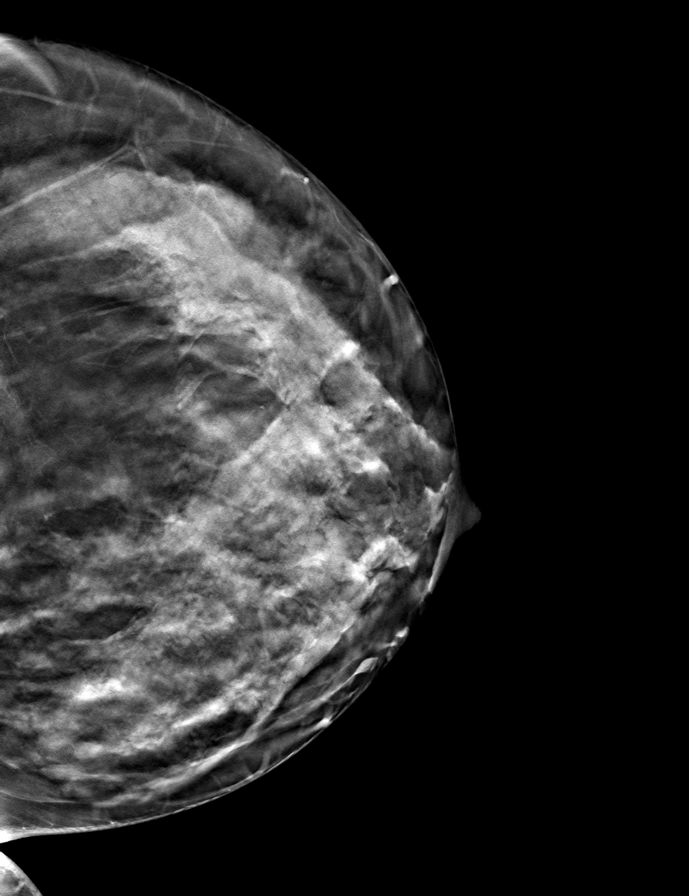

[R MLO tomo · tomo slice 38/75.0]
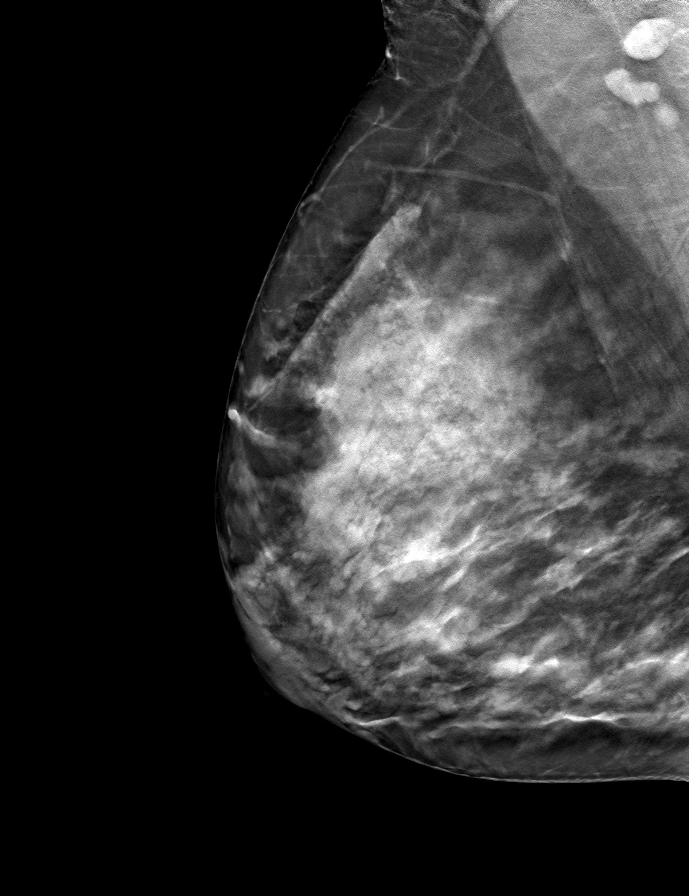

[L MLO tomo · tomo slice 39/77.0]
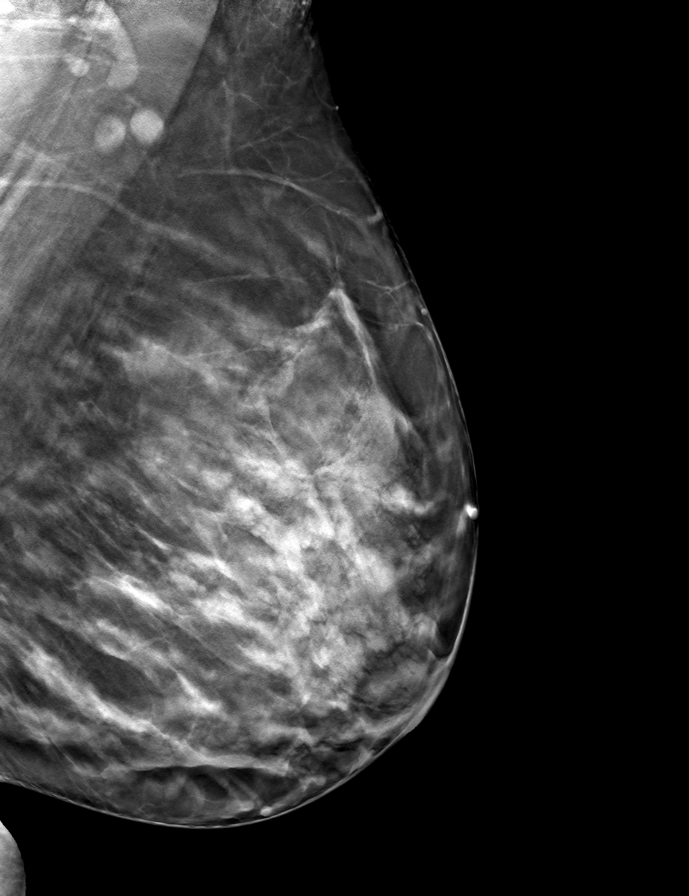

[9 of 24 positions shown; findings below may reference images not displayed]

ACR Breast Density Category c: The breast tissue is heterogeneously
dense, which may obscure small masses.
FINDINGS: Mammographically normal appearing breasts with no findings
suspicious for malignancy in either breast.
IMPRESSION: No evidence of malignancy.

RECOMMENDATION:
Annual screening mammography beginning at age 40.

I have discussed the findings and recommendations with the patient.
If applicable, a reminder letter will be sent to the patient
regarding the next appointment.

BI-RADS CATEGORY  1: Negative.

## 2023-09-12 ENCOUNTER — Telehealth: Payer: Self-pay

## 2023-09-12 NOTE — Telephone Encounter (Signed)
Patient needs a sooner appointment.  Patient is available any day in the afternoon

## 2023-09-19 ENCOUNTER — Encounter: Payer: Self-pay | Admitting: Internal Medicine

## 2023-09-19 ENCOUNTER — Ambulatory Visit: Payer: Self-pay | Admitting: Internal Medicine

## 2023-09-19 VITALS — BP 102/62 | HR 81 | Resp 16 | Ht 58.75 in | Wt 148.0 lb

## 2023-09-19 DIAGNOSIS — H547 Unspecified visual loss: Secondary | ICD-10-CM

## 2023-09-19 DIAGNOSIS — G5603 Carpal tunnel syndrome, bilateral upper limbs: Secondary | ICD-10-CM | POA: Insufficient documentation

## 2023-09-19 DIAGNOSIS — H00014 Hordeolum externum left upper eyelid: Secondary | ICD-10-CM

## 2023-09-19 NOTE — Progress Notes (Signed)
    Subjective:    Patient ID: Katherine Robles, female   DOB: [birth year], 38 y.o.   MRN: [id number]   HPI  Erminio Agent interprets   Blurry vision:  Difficulty reading words on cell phone.  Was to get an eye evaluation last year, but never heard anything.  2.  Bilateral hand and forearm numbness:  Awakens with this in mornings.  She does not know if it starts in hands or not.   Has been a problem in past week. Has had this before, but cannot recall when. Was concerned due to nexplanon placement.  She has had this for 3 years.   No recent overuse with her hands or arms she can recall.    3.  Left upper eyelid with a little ball  in nasal aspect for 2 weeks.  Nontender, though when noted initially was a bit tender.  She does feel it was red for a couple of days last week.    Current Meds  Medication Sig   etonogestrel (NEXPLANON) 68 MG IMPL implant 1 each by Subdermal route once. Placed 712/22 at [facility].   Tea Tree 100 % OIL Mezcla con Gold Bond Foot cream y aplica a sus pies dos veces al dia   No Known Allergies   Review of Systems    Objective:   BP 102/62 (BP Location: Right Arm, Patient Position: Sitting, Cuff Size: Normal)   Pulse 81   Resp 16   Ht 4' 10.75 (1.492 m)   Wt 148 lb (67.1 kg)   BMI 30.15 kg/m   Physical Exam  NAD HEENT:  PERRL, EOMI, RR + OU1 mm pustule on lash line medial upper left eyelid.  Minimal surrounding erythema. Neuro:  + tinels and phalens bilaterally. Good grip bilaterally.  No muscle atrophy    Assessment & Plan   Decreased Visual acuity:  referral again to optometry.  She is in process of updating orange card.  2.  Bilateral carpal tunnel syndrome:  cock up splints to wear nightly.  Ibuprofen  400 to 600 mg twice daily with meals as needed.  3.  Small hordeolum, left upper eyelid:  warm packs twice daily for 20 minutes.

## 2023-09-25 NOTE — Telephone Encounter (Signed)
 Patient has been seen.

## 2023-12-20 ENCOUNTER — Ambulatory Visit: Payer: Self-pay | Admitting: Internal Medicine

## 2024-01-24 ENCOUNTER — Ambulatory Visit: Payer: Self-pay
